# Patient Record
Sex: Female | Born: 1985 | Race: Black or African American | Hispanic: No | Marital: Single | Smoking: Light tobacco smoker
Health system: Southern US, Community
[De-identification: ages and names within clinical notes are randomized; demographics above are authoritative.]

## PROBLEM LIST (undated history)

## (undated) DIAGNOSIS — G43909 Migraine, unspecified, not intractable, without status migrainosus: Secondary | ICD-10-CM

## (undated) DIAGNOSIS — F419 Anxiety disorder, unspecified: Secondary | ICD-10-CM

## (undated) DIAGNOSIS — E785 Hyperlipidemia, unspecified: Secondary | ICD-10-CM

## (undated) DIAGNOSIS — I10 Essential (primary) hypertension: Secondary | ICD-10-CM

## (undated) HISTORY — DX: Anxiety disorder, unspecified: F41.9

## (undated) HISTORY — DX: Migraine, unspecified, not intractable, without status migrainosus: G43.909

## (undated) HISTORY — DX: Essential (primary) hypertension: I10

## (undated) HISTORY — DX: Morbid (severe) obesity due to excess calories: E66.01

## (undated) HISTORY — DX: Hyperlipidemia, unspecified: E78.5

---

## 2011-05-23 ENCOUNTER — Encounter: Payer: Self-pay | Admitting: Internal Medicine

## 2011-05-23 DIAGNOSIS — Z0000 Encounter for general adult medical examination without abnormal findings: Secondary | ICD-10-CM | POA: Insufficient documentation

## 2011-05-23 DIAGNOSIS — Z0001 Encounter for general adult medical examination with abnormal findings: Secondary | ICD-10-CM | POA: Insufficient documentation

## 2011-05-27 ENCOUNTER — Encounter: Payer: Self-pay | Admitting: Internal Medicine

## 2011-05-27 ENCOUNTER — Ambulatory Visit (INDEPENDENT_AMBULATORY_CARE_PROVIDER_SITE_OTHER): Payer: BC Managed Care – PPO | Admitting: Internal Medicine

## 2011-05-27 VITALS — BP 142/90 | HR 85 | Temp 97.9°F | Ht 68.0 in | Wt 256.0 lb

## 2011-05-27 DIAGNOSIS — Z0000 Encounter for general adult medical examination without abnormal findings: Secondary | ICD-10-CM

## 2011-05-27 DIAGNOSIS — E785 Hyperlipidemia, unspecified: Secondary | ICD-10-CM | POA: Insufficient documentation

## 2011-05-27 DIAGNOSIS — I10 Essential (primary) hypertension: Secondary | ICD-10-CM | POA: Insufficient documentation

## 2011-05-27 DIAGNOSIS — O119 Pre-existing hypertension with pre-eclampsia, unspecified trimester: Secondary | ICD-10-CM | POA: Insufficient documentation

## 2011-05-27 HISTORY — DX: Hyperlipidemia, unspecified: E78.5

## 2011-05-27 MED ORDER — HYDROCHLOROTHIAZIDE 25 MG PO TABS: 25.0000 mg | ORAL_TABLET | Freq: Every day | ORAL | Status: DC

## 2011-05-27 MED ORDER — AMLODIPINE BESYLATE 5 MG PO TABS: 5.0000 mg | ORAL_TABLET | Freq: Every day | ORAL | Status: DC

## 2011-05-27 NOTE — Patient Instructions (Addendum)
Take all new medications as prescribed Continue all other medications as before You are otherwise up to date with prevention Please follow lower cholesterol, low salt diet Please focus on increased exercise, weight loss Please check your Blood Pressure on a regular basis, your goal is to be less than 140/90 Please return in 6 months, or sooner if needed

## 2011-05-27 NOTE — Progress Notes (Signed)
Subjective:    Patient ID: Jennifer Allison, female    DOB: 04/23/1985, 26 y.o.   MRN: [id number]  HPI  Here for wellness and to establish as new pt;  Is local banker,  Overall doing ok;  Pt denies CP, worsening SOB, DOE, wheezing, orthopnea, PND, worsening LE edema, palpitations, dizziness or syncope.  Pt denies neurological change such as new Headache, facial or extremity weakness.  Pt denies polydipsia, polyuria, or low sugar symptoms. Pt states overall good compliance with treatment and medications, good tolerability, and trying to follow lower cholesterol diet.  Pt denies worsening depressive symptoms, suicidal ideation or panic. No fever, wt loss, night sweats, loss of appetite, or other constitutional symptoms.  Pt states good ability with ADL's, low fall risk, home safety reviewed and adequate, no significant changes in hearing or vision, and occasionally active with exercise.  Had recent labs per UC essentially normal.  BP has been documented severe elev prior to recent start HCTZ, now still mild elev prior to this visit per pt Past Medical History  Diagnosis Date  . Hypertension   . Migraines   . Hyperlipidemia 05/27/2011   History reviewed. No pertinent past surgical history.  reports that she has never smoked. She does not have any smokeless tobacco history on file. She reports that she drinks alcohol. She reports that she does not use illicit drugs. family history includes Cancer in her other; Diabetes in her others; Hyperlipidemia in her other; Hypertension in her other; Mental illness in her other; and Sudden death in her other. No Known Allergies No current outpatient prescriptions on file prior to visit.   Review of Systems Review of Systems  Constitutional: Negative for diaphoresis, activity change, appetite change and unexpected weight change.  HENT: Negative for hearing loss, ear pain, facial swelling, mouth sores and neck stiffness.   Eyes: Negative for pain, redness and  visual disturbance.  Respiratory: Negative for shortness of breath and wheezing.   Cardiovascular: Negative for chest pain and palpitations.  Gastrointestinal: Negative for diarrhea, blood in stool, abdominal distention and rectal pain.  Genitourinary: Negative for hematuria, flank pain and decreased urine volume.  Musculoskeletal: Negative for myalgias and joint swelling.  Skin: Negative for color change and wound.  Neurological: Negative for syncope and numbness.  Hematological: Negative for adenopathy.  Psychiatric/Behavioral: Negative for hallucinations, self-injury, decreased concentration and agitation.      Objective:   Physical Exam BP 142/90  Pulse 85  Temp(Src) 97.9 F (36.6 C) (Oral)  Ht 5\' 8"  (1.727 m)  Wt 256 lb (116.121 kg)  BMI 38.92 kg/m2  SpO2 98%  LMP 05/20/2011 Physical Exam  VS noted Constitutional: Pt is oriented to person, place, and time. Appears well-developed and well-nourished./severe to morbid obese  HENT:  Head: Normocephalic and atraumatic.  Right Ear: External ear normal.  Left Ear: External ear normal.  Nose: Nose normal.  Mouth/Throat: Oropharynx is clear and moist.  Eyes: Conjunctivae and EOM are normal. Pupils are equal, round, and reactive to light.  Neck: Normal range of motion. Neck supple. No JVD present. No tracheal deviation present.  Cardiovascular: Normal rate, regular rhythm, normal heart sounds and intact distal pulses.   Pulmonary/Chest: Effort normal and breath sounds normal.  Abdominal: Soft. Bowel sounds are normal. There is no tenderness.  Musculoskeletal: Normal range of motion. Exhibits no edema.  Lymphadenopathy:  Has no cervical adenopathy.  Neurological: Pt is alert and oriented to person, place, and time. Pt has normal reflexes. No cranial nerve deficit.  Skin: Skin is warm and dry. No rash noted.  Psychiatric:  Has  normal mood and affect. Behavior is normal.     Assessment & Plan:

## 2011-05-27 NOTE — Assessment & Plan Note (Signed)
Uncontrolled, to add amlodpine 5 mg daily, f/u BP at home and next visit

## 2011-05-27 NOTE — Assessment & Plan Note (Signed)

## 2011-08-26 ENCOUNTER — Telehealth: Payer: Self-pay

## 2011-08-26 MED ORDER — DILTIAZEM HCL ER COATED BEADS 180 MG PO CP24: 180.0000 mg | ORAL_CAPSULE | Freq: Every day | ORAL | Status: DC

## 2011-08-26 NOTE — Telephone Encounter (Signed)
Pt called requesting an alternative BP medication. Pt says Norvasc causes HA, grogginess and fatigue. She has tried taking medication at bedtime to help with SE but it did not improve. Pt is currently taking medication every other day only to help with effects (BP stable), please advise.

## 2011-08-26 NOTE — Telephone Encounter (Signed)
It is very unlikely the medication itself is causing these problems  But ok to change diltiazem 180 qd

## 2011-08-26 NOTE — Telephone Encounter (Signed)
Patient informed. 

## 2011-11-12 ENCOUNTER — Ambulatory Visit: Payer: BC Managed Care – PPO | Admitting: Internal Medicine

## 2011-11-16 ENCOUNTER — Ambulatory Visit: Payer: BC Managed Care – PPO | Admitting: Internal Medicine

## 2011-11-18 ENCOUNTER — Other Ambulatory Visit (INDEPENDENT_AMBULATORY_CARE_PROVIDER_SITE_OTHER): Payer: BC Managed Care – PPO

## 2011-11-18 ENCOUNTER — Other Ambulatory Visit: Payer: Self-pay | Admitting: Cardiology

## 2011-11-18 ENCOUNTER — Ambulatory Visit (INDEPENDENT_AMBULATORY_CARE_PROVIDER_SITE_OTHER): Payer: BC Managed Care – PPO | Admitting: Internal Medicine

## 2011-11-18 ENCOUNTER — Encounter: Payer: Self-pay | Admitting: Internal Medicine

## 2011-11-18 VITALS — BP 120/82 | HR 86 | Temp 97.5°F | Ht 68.0 in | Wt 266.0 lb

## 2011-11-18 DIAGNOSIS — R0600 Dyspnea, unspecified: Secondary | ICD-10-CM

## 2011-11-18 DIAGNOSIS — R0609 Other forms of dyspnea: Secondary | ICD-10-CM

## 2011-11-18 DIAGNOSIS — R002 Palpitations: Secondary | ICD-10-CM

## 2011-11-18 DIAGNOSIS — R52 Pain, unspecified: Secondary | ICD-10-CM

## 2011-11-18 DIAGNOSIS — R609 Edema, unspecified: Secondary | ICD-10-CM

## 2011-11-18 DIAGNOSIS — M79604 Pain in right leg: Secondary | ICD-10-CM | POA: Insufficient documentation

## 2011-11-18 DIAGNOSIS — M79609 Pain in unspecified limb: Secondary | ICD-10-CM

## 2011-11-18 DIAGNOSIS — I10 Essential (primary) hypertension: Secondary | ICD-10-CM

## 2011-11-18 DIAGNOSIS — R0989 Other specified symptoms and signs involving the circulatory and respiratory systems: Secondary | ICD-10-CM

## 2011-11-18 LAB — CBC WITH DIFFERENTIAL/PLATELET
Basophils Absolute: 0 10*3/uL (ref 0.0–0.1)
Eosinophils Relative: 1.6 % (ref 0.0–5.0)
HCT: 47.5 % — ABNORMAL HIGH (ref 36.0–46.0)
Hemoglobin: 15.4 g/dL — ABNORMAL HIGH (ref 12.0–15.0)
Lymphocytes Relative: 29.2 % (ref 12.0–46.0)
Lymphs Abs: 3.2 10*3/uL (ref 0.7–4.0)
Monocytes Relative: 7 % (ref 3.0–12.0)
Platelets: 271 10*3/uL (ref 150.0–400.0)
RDW: 14.8 % — ABNORMAL HIGH (ref 11.5–14.6)
WBC: 10.9 10*3/uL — ABNORMAL HIGH (ref 4.5–10.5)

## 2011-11-18 LAB — HEPATIC FUNCTION PANEL
AST: 38 U/L — ABNORMAL HIGH (ref 0–37)
Albumin: 4.4 g/dL (ref 3.5–5.2)
Total Protein: 8.3 g/dL (ref 6.0–8.3)

## 2011-11-18 LAB — BASIC METABOLIC PANEL
BUN: 9 mg/dL (ref 6–23)
CO2: 29 mEq/L (ref 19–32)
Calcium: 9.5 mg/dL (ref 8.4–10.5)
GFR: 101.96 mL/min (ref >60.00)
Glucose, Bld: 93 mg/dL (ref 70–99)

## 2011-11-18 NOTE — Assessment & Plan Note (Addendum)
ECG reviewed as per emr - sinus without acute, and by itself also includes SVT in differential - will need DVT/PE rule out - but if neg should see card - r/o SVT

## 2011-11-18 NOTE — Progress Notes (Signed)
  Subjective:    Patient ID: Jennifer Allison, female    DOB: [birth year], 26 y.o.   MRN: [id number]  HPI  Here with 1 mo hx approx twice per wk palpitations assoc with sob, once when sleeping - woke her up, last about 1 min and resolved, also with dizziness; Pt denies chest wheezing, orthopnea, PND,  or syncope.  Also with unusual RLE pain below the knee with swelling and tightness (none on the left), pain worst at the beginning, minor pain now, had some ? Redness of the leg involved, but no knee pain except possible lower anterolateral; pain worse to stand and walk, better now, cant remember twisting the knee, hasnt been to the gym in over a month;  No fever, trauma, falls, hx of joint issues or gout.  Not on BCP's. No hx of DVT, no FH blood clots.  Missed mense in July, saw GYN.  Not pregnant, on menses now. Pt denies new neurological symptoms such as new headache, or facial or extremity weakness or numbness   Pt denies polydipsia, polyuria.  Has not been taking her diltiazem recently due to dizziness and HA Past Medical History  Diagnosis Date  . Hypertension   . Migraines   . Hyperlipidemia 05/27/2011   No past surgical history on file.  reports that she has never smoked. She does not have any smokeless tobacco history on file. She reports that she drinks alcohol. She reports that she does not use illicit drugs. family history includes Cancer in her other; Diabetes in her others; Hyperlipidemia in her other; Hypertension in her other; Mental illness in her other; and Sudden death in her other. No Known Allergies Current Outpatient Prescriptions on File Prior to Visit  Medication Sig Dispense Refill  . hydrochlorothiazide (HYDRODIURIL) 25 MG tablet Take 1 tablet (25 mg total) by mouth daily.  90 tablet  3  . diltiazem (DILT-CD) 180 MG 24 hr capsule Take 1 capsule (180 mg total) by mouth daily.  90 capsule  3   Review of Systems  Constitutional: Negative for diaphoresis and unexpected weight  change.  HENT: Negative for tinnitus.   Eyes: Negative for photophobia and visual disturbance.  Respiratory: Negative for choking and stridor.   Gastrointestinal: Negative for vomiting and blood in stool.  Genitourinary: Negative for hematuria and decreased urine volume.  Skin: Negative for color change and wound.  Neurological: Negative for tremors and numbness.  Psychiatric/Behavioral: Negative for decreased concentration. The patient is not hyperactive.      Objective:   Physical Exam BP 120/82  Pulse 86  Temp 97.5 F (36.4 C) (Oral)  Ht 5\' 8"  (1.727 m)  Wt 266 lb (120.657 kg)  BMI 40.45 kg/m2  SpO2 97% Physical Exam  VS noted, not ill appearing Constitutional: Pt appears well-developed and well-nourished.  HENT: Head: Normocephalic.  Right Ear: External ear normal.  Left Ear: External ear normal.  Eyes: Conjunctivae and EOM are normal. Pupils are equal, round, and reactive to light.  Neck: Normal range of motion. Neck supple.  Cardiovascular: Normal rate and regular rhythm.   Pulmonary/Chest: Effort normal and breath sounds normal.  Abd:  Soft, NT, non-distended, + BS Neurological: Pt is alert. Not confused motor/gait intact Skin: no erythema, ulcer Right knee with ? Mild effusion; RLEwith 2+ edema/mild tender below knee Psychiatric: Pt behavior is normal. Thought content normal.     Assessment & Plan:

## 2011-11-18 NOTE — Patient Instructions (Addendum)
Please see the [facility] before leaving to have the CT scan scheduled asap, as well as the Right leg test to make sure there is no blood clot If the CT and the right leg doppler tests are negative, you should see Cardiology for the palpitations, and orthopedic for the right knee and leg Please go to LAB in the Basement for the blood and/or urine tests to be done today You will be contacted by phone if any changes need to be made immediately.  Otherwise, you will receive a letter about your results with an explanation. Please remember to sign up for My Chart at your earliest convenience, as this will be important to you in the future with finding out test results. OK to stop the diltiazem, Continue all other medications as before

## 2011-11-18 NOTE — Assessment & Plan Note (Addendum)
1 mo recurrent episodes nonexertional dyspnea with palpitations, sob, also with RLE swelling suspecious for DVT at same time - will need to r/o PE - for CT angio  Note:  Total time for pt hx, exam, review of record with pt in the room, determination of diagnoses and plan for further eval and tx is > 40 min, with over 50% spent in coordination and counseling of patient

## 2011-11-18 NOTE — Assessment & Plan Note (Signed)
With unilateral swelling x 1 mo with pain - knee effusion is in differential, but need r/o DVT - for LE venous dopplers, d-dimer; if neg should see ortho for right knee pain

## 2011-11-18 NOTE — Assessment & Plan Note (Addendum)
Ok for d/c diltiazem, to hold further med at this time

## 2011-11-19 ENCOUNTER — Ambulatory Visit (INDEPENDENT_AMBULATORY_CARE_PROVIDER_SITE_OTHER)
Admission: RE | Admit: 2011-11-19 | Discharge: 2011-11-19 | Disposition: A | Discharge status: Home / Self Care | Payer: BC Managed Care – PPO | Source: Ambulatory Visit | Attending: Internal Medicine | Admitting: Internal Medicine

## 2011-11-19 ENCOUNTER — Encounter: Payer: Self-pay | Admitting: Internal Medicine

## 2011-11-19 ENCOUNTER — Telehealth: Payer: Self-pay | Admitting: Internal Medicine

## 2011-11-19 ENCOUNTER — Encounter (INDEPENDENT_AMBULATORY_CARE_PROVIDER_SITE_OTHER): Payer: BC Managed Care – PPO

## 2011-11-19 DIAGNOSIS — M7989 Other specified soft tissue disorders: Secondary | ICD-10-CM

## 2011-11-19 DIAGNOSIS — R002 Palpitations: Secondary | ICD-10-CM

## 2011-11-19 DIAGNOSIS — M79604 Pain in right leg: Secondary | ICD-10-CM

## 2011-11-19 DIAGNOSIS — R52 Pain, unspecified: Secondary | ICD-10-CM

## 2011-11-19 DIAGNOSIS — R0609 Other forms of dyspnea: Secondary | ICD-10-CM

## 2011-11-19 DIAGNOSIS — R609 Edema, unspecified: Secondary | ICD-10-CM

## 2011-11-19 DIAGNOSIS — R0600 Dyspnea, unspecified: Secondary | ICD-10-CM

## 2011-11-19 DIAGNOSIS — M79609 Pain in unspecified limb: Secondary | ICD-10-CM

## 2011-11-19 DIAGNOSIS — M25569 Pain in unspecified knee: Secondary | ICD-10-CM

## 2011-11-19 LAB — TSH: TSH: 1.23 u[IU]/mL (ref 0.35–5.50)

## 2011-11-19 LAB — D-DIMER, QUANTITATIVE: D-Dimer, Quant: 0.27 ug/mL-FEU (ref 0.00–0.48)

## 2011-11-19 MED ORDER — IOHEXOL 350 MG/ML SOLN
80.0000 mL | Freq: Once | INTRAVENOUS | Status: AC | PRN
  Administered 2011-11-19: 80 mL via INTRAVENOUS

## 2011-11-19 NOTE — Telephone Encounter (Signed)
Called the patient left detailed message of results. 

## 2011-11-19 NOTE — Telephone Encounter (Signed)
i received notice pt is neg for acute DVT RLE  OK for card/ortho eval as d/w pt at her visit

## 2011-11-19 NOTE — Telephone Encounter (Signed)
Message copied by Corwin Levins on Thu Nov 19, 2011  2:24 PM ------      Message from: Richardean Canal      Created: Thu Nov 19, 2011  1:29 PM      Regarding: prelim       Patient appears negative for acute DVT in the right leg.                  Thank you

## 2011-11-25 ENCOUNTER — Ambulatory Visit: Payer: BC Managed Care – PPO | Admitting: Cardiovascular Disease

## 2011-11-25 ENCOUNTER — Ambulatory Visit: Payer: BC Managed Care – PPO | Admitting: Internal Medicine

## 2012-01-11 ENCOUNTER — Ambulatory Visit: Payer: BC Managed Care – PPO | Admitting: Cardiovascular Disease

## 2012-04-21 ENCOUNTER — Telehealth: Payer: Self-pay | Admitting: Internal Medicine

## 2012-04-21 MED ORDER — AMLODIPINE BESYLATE 5 MG PO TABS: 5.0000 mg | ORAL_TABLET | Freq: Every day | ORAL | Status: DC

## 2012-04-21 NOTE — Telephone Encounter (Signed)
Ok to change the hct to amlodipine 5 mg, which normally has no significant side effects

## 2012-04-21 NOTE — Telephone Encounter (Signed)
Patient says that her blood pressure medications is still making her sick and she would like to discuss other options

## 2012-04-22 NOTE — Telephone Encounter (Signed)
I am very confused about her med regimen as the taztia is not on her list  I would take the amlodipine, and check her BP at home or drug store, and ROV in 4wks

## 2012-04-22 NOTE — Telephone Encounter (Signed)
Called the patient and she stated the HCT was not making her sick, but was [place] that made her sick.  Is it ok to discontinue the [place] and replace with Amlodipine, also to continue on HCT?

## 2012-04-22 NOTE — Telephone Encounter (Signed)
Called left message to call back 

## 2012-04-22 NOTE — Telephone Encounter (Signed)
Called the patient informed of MD instructions. 

## 2012-11-29 ENCOUNTER — Other Ambulatory Visit: Payer: Self-pay | Admitting: Internal Medicine

## 2013-09-07 IMAGING — CT CT ANGIO CHEST
2 of 6 series · 19 of 36 positions shown · IV contrast (Omnipaque 300)
Comparison: None.

CLINICAL DATA: Shortness of breath, right leg swelling.

CT ANGIOGRAPHY CHEST
TECHNIQUE: Multidetector CT imaging of the chest using the
standard protocol during bolus administration of intravenous
contrast. Multiplanar reconstructed images including MIPs were
obtained and reviewed to evaluate the vascular anatomy.
Contrast: 80mL OMNIPAQUE IOHEXOL 350 MG/ML SOLN

[Series 6: thins (id) / (id) · axial · 0.75mm/px · z∈[-230,-19]mm · 18 of 235 slices shown]
[im 12/235  lung]
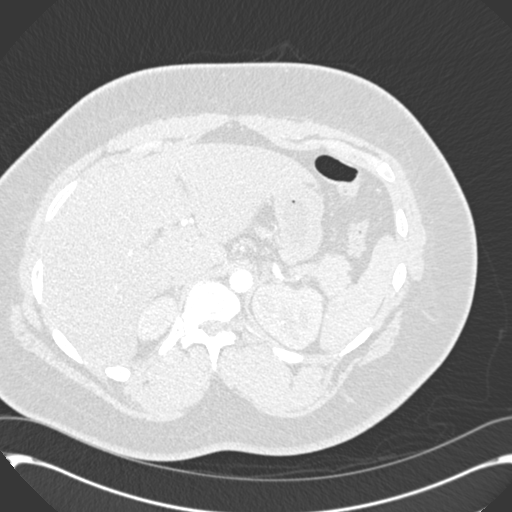
[im 24/235  mediastinal]
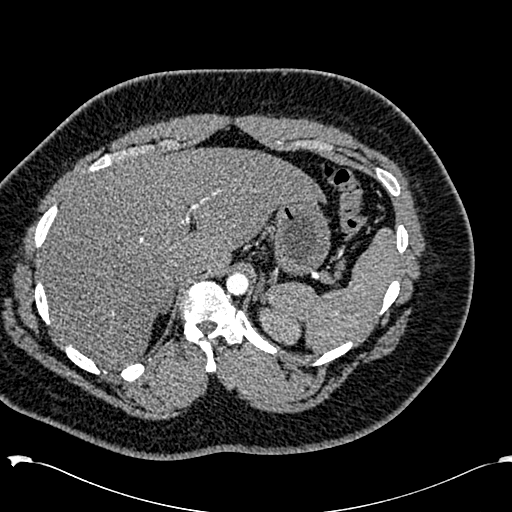
[im 36/235  lung]
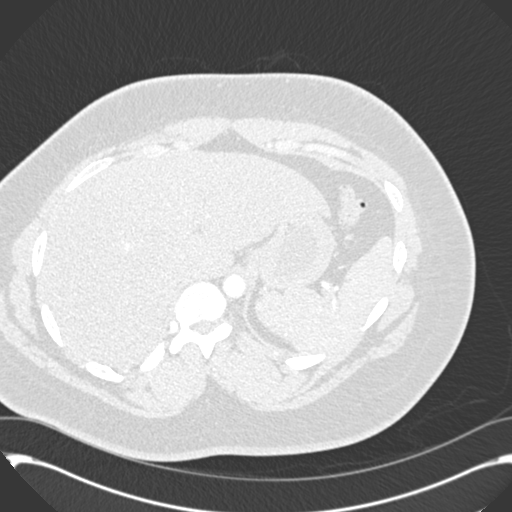
[im 47/235  mediastinal]
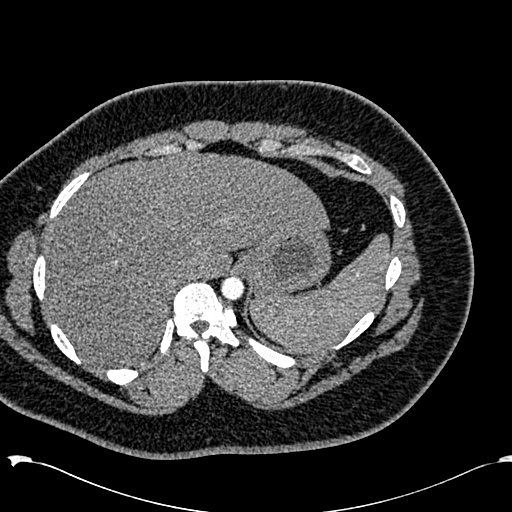
[im 59/235  lung]
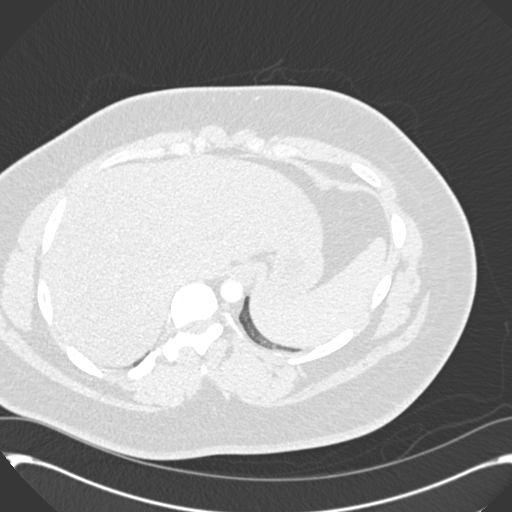
[im 71/235  mediastinal]
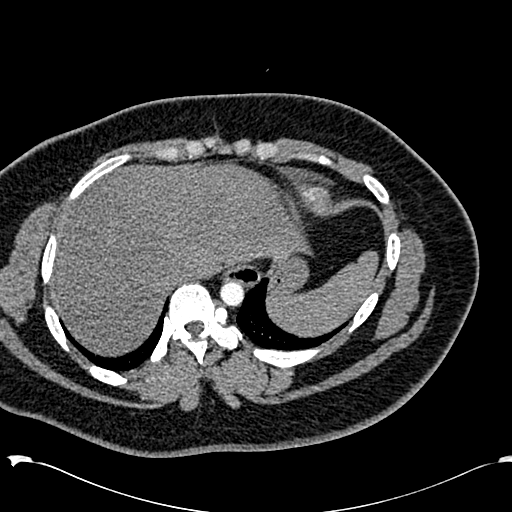
[im 82/235  lung]
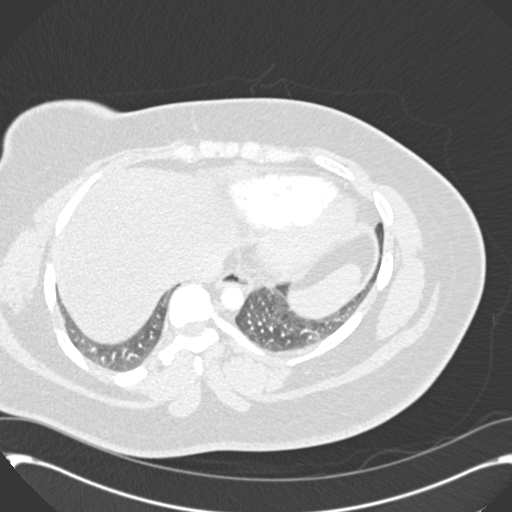
[im 94/235  mediastinal]
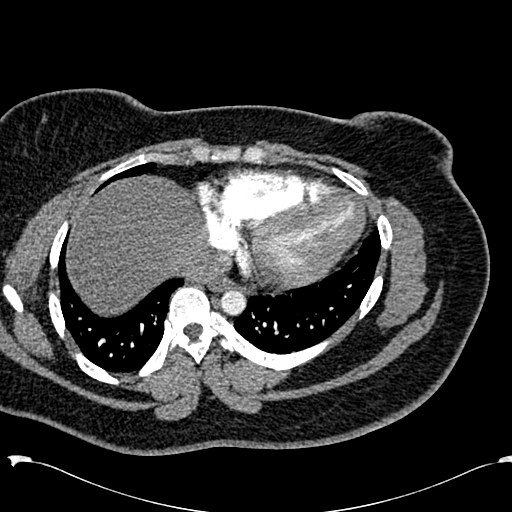
[im 106/235  lung]
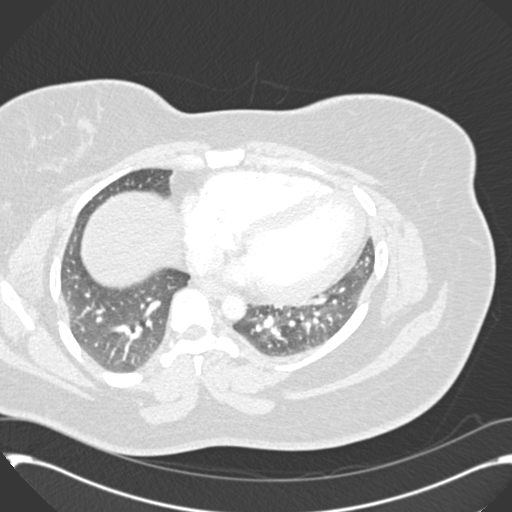
[im 129/235  mediastinal]
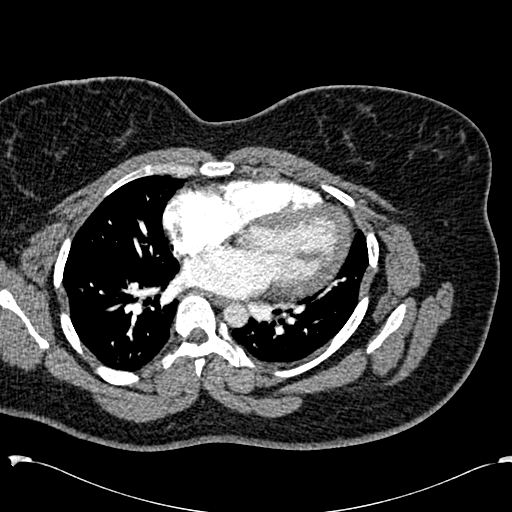
[im 141/235  lung]
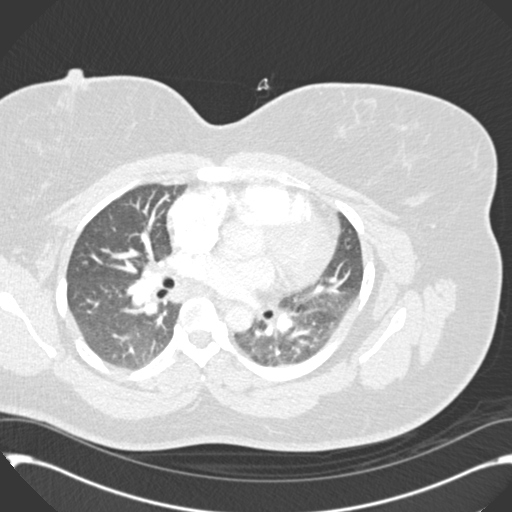
[im 153/235  mediastinal]
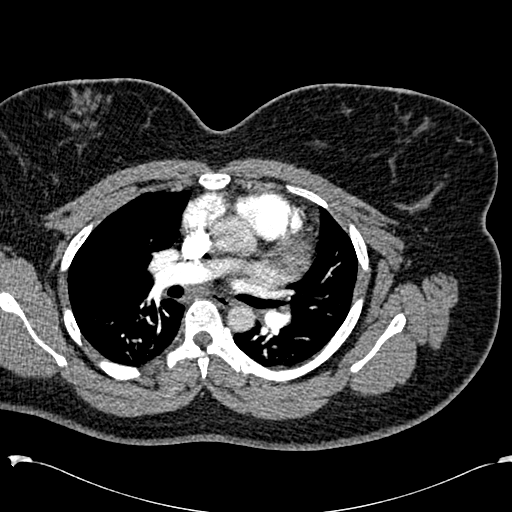
[im 164/235  lung]
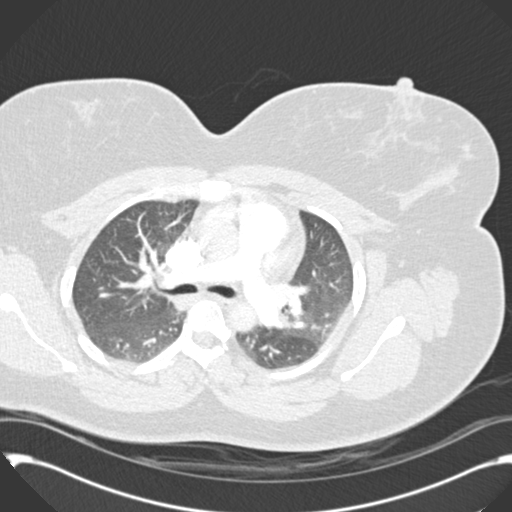
[im 176/235  mediastinal]
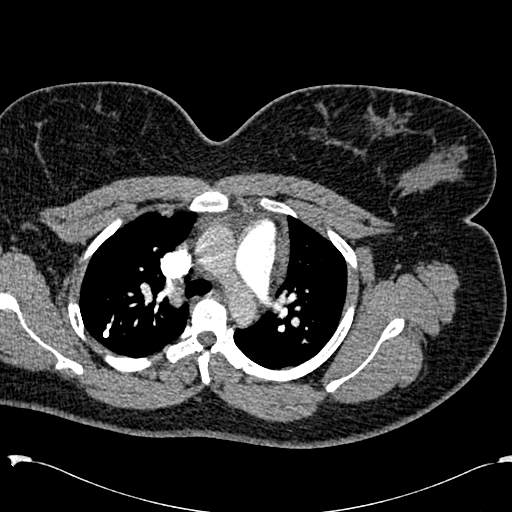
[im 188/235  lung]
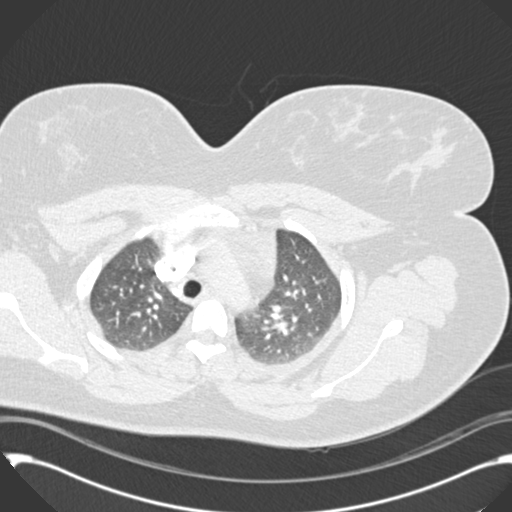
[im 199/235  mediastinal]
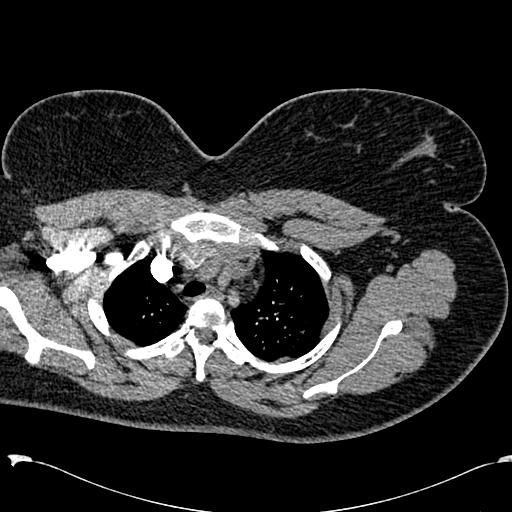
[im 211/235  lung]
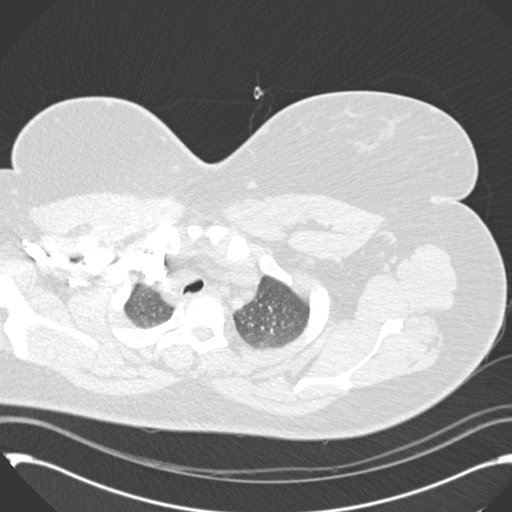
[im 223/235  mediastinal]
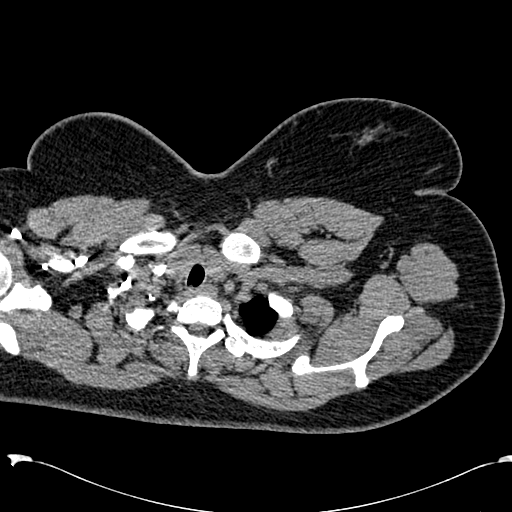

[Series 602: cor mpr · coronal · 0.75mm/px · 1 of 79 slices shown]
[im 40/79  mediastinal]
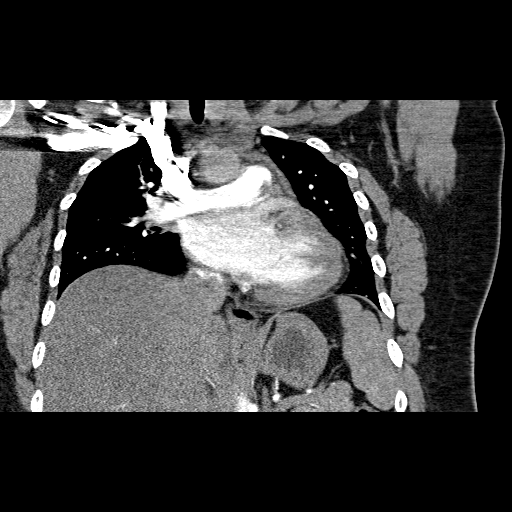

[19 of 36 positions shown; findings below may reference images not displayed]

FINDINGS: Nonspecific anterior mediastinal soft tissue may reflect
residual thymus. Asymmetric soft tissue density within the left
breast on image 60 series 6.  No pulmonary arterial branch filling
defect.  Mild cardiomegaly.  No pleural or pericardial effusion.
Normal caliber aorta.

Low attenuation of the liver may reflect fatty infiltration.
Punctate splenic calcifications are in keeping with sequelae of
prior granulomas infection.

No intrathoracic lymphadenopathy. Calcified mediastinal and right
hilar lymph nodes.

Central airways are patent.

Mosaic attenuation of the left greater than right lung base may
reflect areas of air trapping and atelectasis.  No confluent
airspace opacity.  Calcified nodule right upper lobe suggest
sequelae of granulomas infection.  No acute osseous finding.
IMPRESSION: No pulmonary arterial filling defect identified.

Cardiomegaly.

Mosaic attenuation within the lung bases may reflect air trapping
and/or atelectasis.

Hepatic steatosis suggested.

Soft tissue density within the left breast is favored to reflect
asymmetric breast tissue.  Consider mammographic correlation.

## 2013-10-02 ENCOUNTER — Other Ambulatory Visit: Payer: Self-pay | Admitting: Internal Medicine

## 2013-10-04 ENCOUNTER — Other Ambulatory Visit: Payer: Self-pay | Admitting: Internal Medicine

## 2013-10-18 ENCOUNTER — Other Ambulatory Visit (INDEPENDENT_AMBULATORY_CARE_PROVIDER_SITE_OTHER): Payer: BC Managed Care – PPO

## 2013-10-18 ENCOUNTER — Ambulatory Visit (INDEPENDENT_AMBULATORY_CARE_PROVIDER_SITE_OTHER): Payer: BC Managed Care – PPO | Admitting: Internal Medicine

## 2013-10-18 ENCOUNTER — Encounter: Payer: Self-pay | Admitting: Internal Medicine

## 2013-10-18 VITALS — BP 114/88 | HR 93 | Temp 98.5°F | Wt 285.2 lb

## 2013-10-18 DIAGNOSIS — I10 Essential (primary) hypertension: Secondary | ICD-10-CM

## 2013-10-18 DIAGNOSIS — Z0000 Encounter for general adult medical examination without abnormal findings: Secondary | ICD-10-CM

## 2013-10-18 LAB — HEPATIC FUNCTION PANEL
ALBUMIN: 4.1 g/dL (ref 3.5–5.2)
ALT: 32 U/L (ref 0–35)
AST: 27 U/L (ref 0–37)
Alkaline Phosphatase: 92 U/L (ref 39–117)
Bilirubin, Direct: 0.1 mg/dL (ref 0.0–0.3)
Total Bilirubin: 0.7 mg/dL (ref 0.2–1.2)
Total Protein: 7.9 g/dL (ref 6.0–8.3)

## 2013-10-18 LAB — LIPID PANEL
Cholesterol: 185 mg/dL (ref 0–200)
HDL: 42.4 mg/dL (ref >39.00)
LDL Cholesterol: 124 mg/dL — ABNORMAL HIGH (ref 0–99)
NONHDL: 142.6
Total CHOL/HDL Ratio: 4
Triglycerides: 92 mg/dL (ref 0.0–149.0)
VLDL: 18.4 mg/dL (ref 0.0–40.0)

## 2013-10-18 LAB — BASIC METABOLIC PANEL
BUN: 9 mg/dL (ref 6–23)
CHLORIDE: 106 meq/L (ref 96–112)
CO2: 27 mEq/L (ref 19–32)
CREATININE: 0.9 mg/dL (ref 0.4–1.2)
Calcium: 9.5 mg/dL (ref 8.4–10.5)
GFR: 93.01 mL/min (ref >60.00)
Glucose, Bld: 84 mg/dL (ref 70–99)
Potassium: 4.1 mEq/L (ref 3.5–5.1)
Sodium: 140 mEq/L (ref 135–145)

## 2013-10-18 LAB — CBC WITH DIFFERENTIAL/PLATELET
BASOS PCT: 0.4 % (ref 0.0–3.0)
Basophils Absolute: 0 10*3/uL (ref 0.0–0.1)
Eosinophils Absolute: 0.2 10*3/uL (ref 0.0–0.7)
Eosinophils Relative: 1.4 % (ref 0.0–5.0)
HCT: 49.1 % — ABNORMAL HIGH (ref 36.0–46.0)
Hemoglobin: 16.3 g/dL — ABNORMAL HIGH (ref 12.0–15.0)
Lymphocytes Relative: 30.5 % (ref 12.0–46.0)
Lymphs Abs: 3.7 10*3/uL (ref 0.7–4.0)
MCHC: 33.2 g/dL (ref 30.0–36.0)
MCV: 88.4 fl (ref 78.0–100.0)
MONOS PCT: 7.3 % (ref 3.0–12.0)
Monocytes Absolute: 0.9 10*3/uL (ref 0.1–1.0)
Neutro Abs: 7.4 10*3/uL (ref 1.4–7.7)
Neutrophils Relative %: 60.4 % (ref 43.0–77.0)
Platelets: 278 10*3/uL (ref 150.0–400.0)
RBC: 5.56 Mil/uL — AB (ref 3.87–5.11)
RDW: 13.4 % (ref 11.5–15.5)
WBC: 12.3 10*3/uL — AB (ref 4.0–10.5)

## 2013-10-18 LAB — TSH: TSH: 0.73 u[IU]/mL (ref 0.35–4.50)

## 2013-10-18 MED ORDER — AMLODIPINE BESYLATE 5 MG PO TABS: 5.0000 mg | ORAL_TABLET | Freq: Every day | ORAL | Status: DC

## 2013-10-18 MED ORDER — HYDROCHLOROTHIAZIDE 12.5 MG PO TABS: 12.5000 mg | ORAL_TABLET | Freq: Every day | ORAL | Status: DC

## 2013-10-18 NOTE — Progress Notes (Signed)
Pre visit review using our clinic review tool, if applicable. No additional management support is needed unless otherwise documented below in the visit note. 

## 2013-10-18 NOTE — Progress Notes (Signed)
Subjective:    Patient ID: Jennifer Allison, female    DOB: 05-Sep-1985, 28 y.o.   MRN: [id number]  HPI  Here for wellness and f/u;  Overall doing ok;  Pt denies CP, worsening SOB, DOE, wheezing, orthopnea, PND, worsening LE edema, palpitations, dizziness or syncope.  Pt denies neurological change such as new headache, facial or extremity weakness.  Pt denies polydipsia, polyuria, or low sugar symptoms. Pt states overall good compliance with treatment and medications, good tolerability, and has been trying to follow lower cholesterol diet.  Pt denies worsening depressive symptoms, suicidal ideation or panic. No fever, night sweats, wt loss, loss of appetite, or other constitutional symptoms.  Pt states good ability with ADL's, has low fall risk, home safety reviewed and adequate, no other significant changes in hearing or vision, and only occasionally active with exercise.  Only taking the hct prn for swelling mostly. No acute complaints Past Medical History  Diagnosis Date  . Hypertension   . Migraines   . Hyperlipidemia 05/27/2011   No past surgical history on file.  reports that she has never smoked. She does not have any smokeless tobacco history on file. She reports that she drinks alcohol. She reports that she does not use illicit drugs. family history includes Cancer in her other; Diabetes in her other and other; Hyperlipidemia in her other; Hypertension in her other; Mental illness in her other; Sudden death in her other. No Known Allergies No current outpatient prescriptions on file prior to visit.   No current facility-administered medications on file prior to visit.    Review of Systems Constitutional: Negative for increased diaphoresis, other activity, appetite or other siginficant weight change  HENT: Negative for worsening hearing loss, ear pain, facial swelling, mouth sores and neck stiffness.   Eyes: Negative for other worsening pain, redness or visual disturbance.    Respiratory: Negative for shortness of breath and wheezing.   Cardiovascular: Negative for chest pain and palpitations.  Gastrointestinal: Negative for diarrhea, blood in stool, abdominal distention or other pain Genitourinary: Negative for hematuria, flank pain or change in urine volume.  Musculoskeletal: Negative for myalgias or other joint complaints.  Skin: Negative for color change and wound.  Neurological: Negative for syncope and numbness. other than noted Hematological: Negative for adenopathy. or other swelling Psychiatric/Behavioral: Negative for hallucinations, self-injury, decreased concentration or other worsening agitation.      Objective:   Physical Exam BP 114/88  Pulse 93  Temp(Src) 98.5 F (36.9 C) (Oral)  Wt 285 lb 4 oz (129.389 kg)  SpO2 97% VS noted,  Constitutional: Pt is oriented to person, place, and time. Appears well-developed and well-nourished.  Head: Normocephalic and atraumatic.  Right Ear: External ear normal.  Left Ear: External ear normal.  Nose: Nose normal.  Mouth/Throat: Oropharynx is clear and moist.  Eyes: Conjunctivae and EOM are normal. Pupils are equal, round, and reactive to light.  Neck: Normal range of motion. Neck supple. No JVD present. No tracheal deviation present.  Cardiovascular: Normal rate, regular rhythm, normal heart sounds and intact distal pulses.   Pulmonary/Chest: Effort normal and breath sounds without rales or wheezing  Abdominal: Soft. Bowel sounds are normal. NT. No HSM  Musculoskeletal: Normal range of motion. Exhibits slight trace pedal edema.  Lymphadenopathy:  Has no cervical adenopathy.  Neurological: Pt is alert and oriented to person, place, and time. Pt has normal reflexes. No cranial nerve deficit. Motor grossly intact Skin: Skin is warm and dry. No rash noted.  Psychiatric:  Has normal mood and affect. Behavior is normal.      Assessment & Plan:   BP Readings from Last 3 Encounters:  10/18/13 114/88   11/18/11 120/82  05/27/11 142/90

## 2013-10-18 NOTE — Assessment & Plan Note (Signed)
[facility] for refill meds, stable overall by history and exam, recent data reviewed with pt, and pt to continue medical treatment as before,  to f/u any worsening symptoms or concerns BP Readings from Last 3 Encounters:  10/18/13 114/88  11/18/11 120/82  05/27/11 142/90

## 2013-10-18 NOTE — Patient Instructions (Signed)

## 2013-10-18 NOTE — Assessment & Plan Note (Signed)

## 2013-10-19 ENCOUNTER — Encounter: Payer: Self-pay | Admitting: Internal Medicine

## 2013-10-19 LAB — URINALYSIS, ROUTINE W REFLEX MICROSCOPIC
Bilirubin Urine: NEGATIVE
Ketones, ur: NEGATIVE
NITRITE: NEGATIVE
PH: 5 (ref 5.0–8.0)
SPECIFIC GRAVITY, URINE: 1.025 (ref 1.000–1.030)
Total Protein, Urine: NEGATIVE
URINE GLUCOSE: NEGATIVE
Urobilinogen, UA: 0.2 (ref 0.0–1.0)

## 2015-04-11 ENCOUNTER — Encounter (HOSPITAL_COMMUNITY): Payer: Self-pay | Admitting: Emergency Medicine

## 2015-04-11 ENCOUNTER — Other Ambulatory Visit: Payer: Self-pay

## 2015-04-11 ENCOUNTER — Emergency Department (INDEPENDENT_AMBULATORY_CARE_PROVIDER_SITE_OTHER)
Admission: EM | Admit: 2015-04-11 | Discharge: 2015-04-11 | Disposition: A | Discharge status: Nursing Home (Includes ICF) | Payer: 59 | Source: Home / Self Care | Attending: Family Medicine | Admitting: Family Medicine

## 2015-04-11 ENCOUNTER — Emergency Department (HOSPITAL_COMMUNITY): Payer: 59

## 2015-04-11 ENCOUNTER — Emergency Department (HOSPITAL_COMMUNITY)
Admission: EM | Admit: 2015-04-11 | Discharge: 2015-04-12 | Disposition: A | Discharge status: Home / Self Care | Payer: 59 | Attending: Emergency Medicine | Admitting: Emergency Medicine

## 2015-04-11 DIAGNOSIS — R079 Chest pain, unspecified: Secondary | ICD-10-CM

## 2015-04-11 DIAGNOSIS — Z8639 Personal history of other endocrine, nutritional and metabolic disease: Secondary | ICD-10-CM | POA: Diagnosis not present

## 2015-04-11 DIAGNOSIS — I10 Essential (primary) hypertension: Secondary | ICD-10-CM | POA: Diagnosis not present

## 2015-04-11 DIAGNOSIS — Z880 Allergy status to penicillin: Secondary | ICD-10-CM | POA: Insufficient documentation

## 2015-04-11 DIAGNOSIS — G43909 Migraine, unspecified, not intractable, without status migrainosus: Secondary | ICD-10-CM | POA: Insufficient documentation

## 2015-04-11 DIAGNOSIS — Z79899 Other long term (current) drug therapy: Secondary | ICD-10-CM | POA: Diagnosis not present

## 2015-04-11 DIAGNOSIS — R000 Tachycardia, unspecified: Secondary | ICD-10-CM | POA: Insufficient documentation

## 2015-04-11 LAB — BASIC METABOLIC PANEL
Anion gap: 9 (ref 5–15)
BUN: 10 mg/dL (ref 6–20)
CALCIUM: 9.3 mg/dL (ref 8.9–10.3)
CO2: 24 mmol/L (ref 22–32)
CREATININE: 0.9 mg/dL (ref 0.44–1.00)
Chloride: 106 mmol/L (ref 101–111)
GFR calc Af Amer: >60 mL/min (ref >60)
GLUCOSE: 92 mg/dL (ref 65–99)
Potassium: 3.9 mmol/L (ref 3.5–5.1)
Sodium: 139 mmol/L (ref 135–145)

## 2015-04-11 LAB — CBC
HCT: 51.3 % — ABNORMAL HIGH (ref 36.0–46.0)
HEMOGLOBIN: 17.6 g/dL — AB (ref 12.0–15.0)
MCH: 30.9 pg (ref 26.0–34.0)
MCHC: 34.3 g/dL (ref 30.0–36.0)
MCV: 90.2 fL (ref 78.0–100.0)
Platelets: 286 10*3/uL (ref 150–400)
RBC: 5.69 MIL/uL — ABNORMAL HIGH (ref 3.87–5.11)
RDW: 13.6 % (ref 11.5–15.5)
WBC: 13 10*3/uL — ABNORMAL HIGH (ref 4.0–10.5)

## 2015-04-11 LAB — I-STAT TROPONIN, ED
TROPONIN I, POC: 0 ng/mL (ref 0.00–0.08)
Troponin i, poc: 0 ng/mL (ref 0.00–0.08)

## 2015-04-11 LAB — D-DIMER, QUANTITATIVE: D-Dimer, Quant: <0.27 ug/mL-FEU (ref 0.00–0.50)

## 2015-04-11 MED ORDER — LISINOPRIL 5 MG PO TABS: 5.0000 mg | ORAL_TABLET | Freq: Every day | ORAL | Status: DC

## 2015-04-11 MED ORDER — LISINOPRIL 10 MG PO TABS
5.0000 mg | ORAL_TABLET | Freq: Once | ORAL | Status: AC
  Administered 2015-04-11: 5 mg via ORAL
  Filled 2015-04-11: qty 1

## 2015-04-11 MED ORDER — ASPIRIN 81 MG PO CHEW
324.0000 mg | CHEWABLE_TABLET | Freq: Once | ORAL | Status: AC
  Administered 2015-04-11: 324 mg via ORAL
  Filled 2015-04-11: qty 4

## 2015-04-11 NOTE — ED Provider Notes (Signed)
CSN: [id number]     Arrival date & time 04/11/15  1552 History   First MD Initiated Contact with Patient 04/11/15 1651     Chief Complaint  Patient presents with  . Chest Pain  . Arm Pain   (Consider location/radiation/quality/duration/timing/severity/associated sxs/prior Treatment) Patient is a 30 y.o. female presenting with chest pain and arm pain. The history is provided by the patient. No language interpreter was used.  Chest Pain Pain location:  L chest Pain quality: aching   Pain radiates to:  Does not radiate Pain radiates to the back: no   Pain severity:  No pain Onset quality:  Gradual Timing:  Constant Progression:  Worsening Chronicity:  New Context: no movement   Relieved by:  Nothing Worsened by:  Nothing tried Ineffective treatments:  None tried Risk factors: birth control, high cholesterol and hypertension   Risk factors: no prior DVT/PE   Arm Pain Associated symptoms include chest pain.  Pt had pain in her left leg yesterday.  Pt reports today she began having pain in her chest.  Pt noticed when she awoke this am.  Pt is not taking her blood pressure medications Pt reports tingling in her left arm.  Past Medical History  Diagnosis Date  . Hypertension   . Migraines   . Hyperlipidemia 05/27/2011   History reviewed. No pertinent past surgical history. Family History  Problem Relation Age of Onset  . Hypertension Other   . Mental illness Other   . Hyperlipidemia Other   . Diabetes Other   . Cancer Other     prostate  . Sudden death Other   . Diabetes Other    Social History  Substance Use Topics  . Smoking status: Never Smoker   . Smokeless tobacco: None  . Alcohol Use: Yes     Comment: social   OB History    No data available     Review of Systems  Cardiovascular: Positive for chest pain.  All other systems reviewed and are negative.   Allergies  Review of patient's allergies indicates no known allergies.  Home Medications   Prior to  Admission medications   Medication Sig Start Date End Date Taking? Authorizing Provider  amLODipine (NORVASC) 5 MG tablet Take 1 tablet (5 mg total) by mouth daily. 10/18/13 10/18/14  Biagio Borg, MD  hydrochlorothiazide (HYDRODIURIL) 12.5 MG tablet Take 1 tablet (12.5 mg total) by mouth daily. 10/18/13   Biagio Borg, MD   Meds Ordered and Administered this Visit  Medications - No data to display  BP 187/117 mmHg  Pulse 97  Temp(Src) 98.4 F (36.9 C) (Oral)  Resp 18  SpO2 98% No data found.   Physical Exam  Constitutional: She is oriented to person, place, and time. She appears well-developed and well-nourished.  HENT:  Head: Normocephalic and atraumatic.  Left Ear: External ear normal.  Eyes: Conjunctivae are normal. Pupils are equal, round, and reactive to light.  Neck: Normal range of motion. Neck supple.  Cardiovascular: Normal rate and regular rhythm.   Pulmonary/Chest: Effort normal.  Abdominal: Soft. There is no tenderness.  Musculoskeletal: Normal range of motion.  Neurological: She is oriented to person, place, and time.  Skin: Skin is warm.    ED Course  Procedures (including critical care time)  Labs Review Labs Reviewed - No data to display  Imaging Review No results found.   Visual Acuity Review  Right Eye Distance:   Left Eye Distance:   Bilateral Distance:  Right Eye Near:   Left Eye Near:    Bilateral Near:         MDM  Pt needs to go to ED for troponin and possible ddimer.     1. Chest pain, unspecified chest pain type   2. Essential hypertension       Fransico Meadow, PA-C 04/11/15 1728

## 2015-04-11 NOTE — ED Notes (Signed)
Patient reports yesterday that left leg was throbbing, no known injury.  Today patient has left chest pain and left arm pain.  Reports left palm is numb.  Chest is described as tight.  Patient stopped blood pressure medicine 10/2014-it made her feel bad, nauseated.

## 2015-04-11 NOTE — ED Notes (Signed)
The patient said she started having chest pain last night and this morning.  She thought it would go away and it did not.  She did take aspirin and it eased it a little but then it got worse.  She rates her pain 7/10.  She denies any other symptoms.

## 2015-04-11 NOTE — ED Provider Notes (Signed)
CSN: [id number]     Arrival date & time 04/11/15  1741 History   First MD Initiated Contact with Patient 04/11/15 2130     Chief Complaint  Patient presents with  . Chest Pain    The patient said she started having chest pain last night and this morning.  She thought it would go away and it did not.  She did take aspirin and it eased it a little but then it got worse.     (Consider location/radiation/quality/duration/timing/severity/associated sxs/prior Treatment) Patient is a 30 y.o. female presenting with chest pain. The history is provided by the patient.  Chest Pain Pain location:  L chest Pain quality: sharp and tightness   Pain radiates to:  L shoulder, L arm and upper back Pain severity:  Mild Onset quality:  Gradual Duration: starting last night. Timing:  Constant Progression:  Waxing and waning Chronicity:  New Context: no movement   Relieved by:  Nothing Worsened by:  Nothing tried Ineffective treatments:  None tried Associated symptoms: no abdominal pain, no cough, no dizziness, no fatigue, no fever, no nausea, no shortness of breath and not vomiting   Risk factors: high cholesterol, hypertension and obesity     Past Medical History  Diagnosis Date  . Hypertension   . Migraines   . Hyperlipidemia 05/27/2011   History reviewed. No pertinent past surgical history. Family History  Problem Relation Age of Onset  . Hypertension Other   . Mental illness Other   . Hyperlipidemia Other   . Diabetes Other   . Cancer Other     prostate  . Sudden death Other   . Diabetes Other    Social History  Substance Use Topics  . Smoking status: Never Smoker   . Smokeless tobacco: None  . Alcohol Use: Yes     Comment: social   OB History    No data available     Review of Systems  Constitutional: Negative for fever and fatigue.  HENT: Negative for congestion.   Eyes: Negative for visual disturbance.  Respiratory: Negative for cough and shortness of breath.    Cardiovascular: Positive for chest pain.  Gastrointestinal: Negative for nausea, vomiting and abdominal pain.  Genitourinary: Negative.   Musculoskeletal: Negative.   Skin: Negative for pallor, rash and wound.  Neurological: Negative for dizziness.      Allergies  Penicillins  Home Medications   Prior to Admission medications   Medication Sig Start Date End Date Taking? Authorizing Provider  amLODipine (NORVASC) 5 MG tablet Take 1 tablet (5 mg total) by mouth daily. Patient not taking: Reported on 04/11/2015 10/18/13 04/11/15  Biagio Borg, MD  hydrochlorothiazide (HYDRODIURIL) 12.5 MG tablet Take 1 tablet (12.5 mg total) by mouth daily. Patient not taking: Reported on 04/11/2015 10/18/13   Biagio Borg, MD  lisinopril (PRINIVIL,ZESTRIL) 5 MG tablet Take 1 tablet (5 mg total) by mouth daily. 04/11/15   Heriberto Antigua, MD   BP 164/98 mmHg  Pulse 80  Temp(Src) 97.7 F (36.5 C) (Oral)  Resp 16  SpO2 100% Physical Exam  Constitutional: She is oriented to person, place, and time. She appears well-developed and well-nourished. No distress.  HENT:  Head: Normocephalic and atraumatic.  Mouth/Throat: No oropharyngeal exudate.  Eyes: EOM are normal. Pupils are equal, round, and reactive to light.  Neck: Normal range of motion. Neck supple.  Cardiovascular: Regular rhythm, normal heart sounds and intact distal pulses.   tachycardia  Pulmonary/Chest: Effort normal and breath sounds normal. No  respiratory distress. She exhibits no tenderness.  Abdominal: Soft. She exhibits no distension. There is no tenderness. There is no rebound and no guarding.  Musculoskeletal: Normal range of motion. She exhibits no edema or tenderness.  Neurological: She is alert and oriented to person, place, and time. No cranial nerve deficit. She exhibits normal muscle tone. Coordination normal.  Skin: Skin is warm and dry. No rash noted. She is not diaphoretic. No erythema. No pallor.  Psychiatric: She has a normal  mood and affect.  Nursing note and vitals reviewed.   ED Course  Procedures (including critical care time) Labs Review Labs Reviewed  CBC - Abnormal; Notable for the following:    WBC 13.0 (*)    RBC 5.69 (*)    Hemoglobin 17.6 (*)    HCT 51.3 (*)    All other components within normal limits  BASIC METABOLIC PANEL  D-DIMER, QUANTITATIVE (NOT AT [facility])  I-STAT TROPOININ, ED  I-STAT TROPOININ, ED    Imaging Review Dg Chest 2 View  04/11/2015  CLINICAL DATA:  Chest pain with tingling of left arm starting last night. EXAM: CHEST  2 VIEW COMPARISON:  CT chest November 19, 2011 FINDINGS: The heart size and mediastinal contours are within normal limits. There is a calcified granuloma in the right upper lobe unchanged compared to prior CT. There is no focal infiltrate, pulmonary edema, or pleural effusion. The visualized skeletal structures are unremarkable. IMPRESSION: No active cardiopulmonary disease. Electronically Signed   By: Abelardo Diesel M.D.   On: 04/11/2015 18:21   I have personally reviewed and evaluated these images and lab results as part of my medical decision-making.   EKG Interpretation   Date/Time:  Thursday April 11 2015 17:51:39 EST Ventricular Rate:  90 PR Interval:  132 QRS Duration: 82 QT Interval:  356 QTC Calculation: 435 R Axis:   99 Text Interpretation:  Normal sinus rhythm Biatrial enlargement Rightward  axis Abnormal ECG No significant change was found Confirmed by Wyvonnia Dusky   MD, STEPHEN [phone]) on 04/11/2015 9:57:35 PM      MDM   Final diagnoses:  Chest pain, unspecified chest pain type  Essential hypertension    Patient is a 30 year old female with a history of hypertension who presents with left-sided chest tightness from urgent care. She says this started over the night without associated shortness of breath, nausea, vomiting, or presyncopal symptoms. Further history and exam as above notable for slight tachycardia and hypertension. She states  that she has not been taking her medicines as prescribed. Patient has a negative delta troponin, negative d-dimer, reassuring BMP CBC and EKG without acute ischemic changes. Patient has a reassuring 4 extremity blood pressures here so doubt dissection or aortic pathology at this time. Patient advised about her hypertension and need to be on medication.  I have reviewed all labs and workup. Patient stable for discharge home.  I have reviewed all results with the patient. Advised to f/u with cardiology and her pcp. Will try lisinopril.. Patient agrees to stated plan. All questions answered. Advised to call or return to have any questions, new symptoms, change in symptoms, or symptoms that they do not understand.     Heriberto Antigua, MD 04/12/15 [place], MD 04/13/15 1055

## 2015-04-11 NOTE — Discharge Instructions (Signed)
Hypertension Hypertension, commonly called high blood pressure, is when the force of blood pumping through your arteries is too strong. Your arteries are the blood vessels that carry blood from your heart throughout your body. A blood pressure reading consists of a higher number over a lower number, such as 110/72. The higher number (systolic) is the pressure inside your arteries when your heart pumps. The lower number (diastolic) is the pressure inside your arteries when your heart relaxes. Ideally you want your blood pressure below 120/80. Hypertension forces your heart to work harder to pump blood. Your arteries may become narrow or stiff. Having untreated or uncontrolled hypertension can cause heart attack, stroke, kidney disease, and other problems. RISK FACTORS Some risk factors for high blood pressure are controllable. Others are not.  Risk factors you cannot control include:   Race. You may be at higher risk if you are African American.  Age. Risk increases with age.  Gender. Men are at higher risk than women before age 45 years. After age 65, women are at higher risk than men. Risk factors you can control include:  Not getting enough exercise or physical activity.  Being overweight.  Getting too much fat, sugar, calories, or salt in your diet.  Drinking too much alcohol. SIGNS AND SYMPTOMS Hypertension does not usually cause signs or symptoms. Extremely high blood pressure (hypertensive crisis) may cause headache, anxiety, shortness of breath, and nosebleed. DIAGNOSIS To check if you have hypertension, your health care provider will measure your blood pressure while you are seated, with your arm held at the level of your heart. It should be measured at least twice using the same arm. Certain conditions can cause a difference in blood pressure between your right and left arms. A blood pressure reading that is higher than normal on one occasion does not mean that you need treatment. If  it is not clear whether you have high blood pressure, you may be asked to return on a different day to have your blood pressure checked again. Or, you may be asked to monitor your blood pressure at home for 1 or more weeks. TREATMENT Treating high blood pressure includes making lifestyle changes and possibly taking medicine. Living a healthy lifestyle can help lower high blood pressure. You may need to change some of your habits. Lifestyle changes may include:  Following the DASH diet. This diet is high in fruits, vegetables, and whole grains. It is low in salt, red meat, and added sugars.  Keep your sodium intake below 2,300 mg per day.  Getting at least 30-45 minutes of aerobic exercise at least 4 times per week.  Losing weight if necessary.  Not smoking.  Limiting alcoholic beverages.  Learning ways to reduce stress. Your health care provider may prescribe medicine if lifestyle changes are not enough to get your blood pressure under control, and if one of the following is true:  You are 18-59 years of age and your systolic blood pressure is above 140.  You are 60 years of age or older, and your systolic blood pressure is above 150.  Your diastolic blood pressure is above 90.  You have diabetes, and your systolic blood pressure is over 140 or your diastolic blood pressure is over 90.  You have kidney disease and your blood pressure is above 140/90.  You have heart disease and your blood pressure is above 140/90. Your personal target blood pressure may vary depending on your medical conditions, your age, and other factors. HOME CARE INSTRUCTIONS    Have your blood pressure rechecked as directed by your health care provider.   Take medicines only as directed by your health care provider. Follow the directions carefully. Blood pressure medicines must be taken as prescribed. The medicine does not work as well when you skip doses. Skipping doses also puts you at risk for  problems.  Do not smoke.   Monitor your blood pressure at home as directed by your health care provider. SEEK MEDICAL CARE IF:   You think you are having a reaction to medicines taken.  You have recurrent headaches or feel dizzy.  You have swelling in your ankles.  You have trouble with your vision. SEEK IMMEDIATE MEDICAL CARE IF:  You develop a severe headache or confusion.  You have unusual weakness, numbness, or feel faint.  You have severe chest or abdominal pain.  You vomit repeatedly.  You have trouble breathing. MAKE SURE YOU:   Understand these instructions.  Will watch your condition.  Will get help right away if you are not doing well or get worse.   This information is not intended to replace advice given to you by your health care provider. Make sure you discuss any questions you have with your health care provider.   Document Released: 02/16/2005 Document Revised: 07/03/2014 Document Reviewed: 12/09/2012 Elsevier Interactive Patient Education 2016 Elsevier Inc.  

## 2015-04-17 ENCOUNTER — Encounter: Payer: Self-pay | Admitting: Cardiovascular Disease

## 2015-04-17 ENCOUNTER — Ambulatory Visit (INDEPENDENT_AMBULATORY_CARE_PROVIDER_SITE_OTHER): Payer: 59 | Admitting: Cardiovascular Disease

## 2015-04-17 ENCOUNTER — Telehealth: Payer: Self-pay | Admitting: Cardiovascular Disease

## 2015-04-17 VITALS — BP 166/118 | HR 96 | Ht 67.0 in | Wt 296.0 lb

## 2015-04-17 DIAGNOSIS — I119 Hypertensive heart disease without heart failure: Secondary | ICD-10-CM | POA: Diagnosis not present

## 2015-04-17 MED ORDER — HYDROCHLOROTHIAZIDE 12.5 MG PO TABS: 12.5000 mg | ORAL_TABLET | Freq: Every day | ORAL | Status: DC

## 2015-04-17 MED ORDER — LISINOPRIL 10 MG PO TABS: 10.0000 mg | ORAL_TABLET | Freq: Every day | ORAL | Status: DC

## 2015-04-17 NOTE — Telephone Encounter (Signed)
Pt informed refill with correct routing was resent to pharmacy.

## 2015-04-17 NOTE — Assessment & Plan Note (Signed)
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 She is severely overweight and probably doesn't. Adequate attention to her diet and salt intake. She was recently seen in the emergency room with chest pain and hypertension. She has been noncompliant with her outpatient antihypertensive regimen including amlodipine and hydrochlorothiazide because of side effects.  She does have symptoms compatible with obstructive sleep apnea. Talked about the importance of salt restriction, dietary modification and exercise. I'm going to get a 2-D echo to assess for LVH, increase her lisinopril from 5-10 mg a day. I've encouraged her to obtain a home blood pressure monitor and keep a blood pressure log over the next 30 days. She'll will see Erasmo Downer back in follow-up. I'm also going to arrange for her to undergo a outpatient sleep study.

## 2015-04-17 NOTE — Assessment & Plan Note (Signed)
History of mild hyperlipidemia with her last lipid profile documented on 10/18/13 revealed total cholesterol 185, LDL of 124 and HDL of 42. I suspect this is highly dietary  dependent.

## 2015-04-17 NOTE — Patient Instructions (Addendum)
Medication Instructions:  Your physician has recommended you make the following change in your medication:  1) STOP Norvasc 2) START Hydrocholrothiazide 12.5 mg by mouth ONCE daily 30 START Lisinopril 10 mg by mouth ONCE daily   Labwork: none  Testing/Procedures: Your physician has requested that you have an echocardiogram. Echocardiography is a painless test that uses sound waves to create images of your heart. It provides your doctor with information about the size and shape of your heart and how well your heart's chambers and valves are working. This procedure takes approximately one hour. There are no restrictions for this procedure.  Your physician has recommended that you have a sleep study. This test records several body functions during sleep, including: brain activity, eye movement, oxygen and carbon dioxide blood levels, heart rate and rhythm, breathing rate and rhythm, the flow of air through your mouth and nose, snoring, body muscle movements, and chest and belly movement.   Follow-Up: Your physician recommends that you schedule a follow-up appointment in:  1 month with [facility] Your physician recommends that you schedule a follow-up appointment in: 6-8 weeks with Dr. Gwenlyn Found  Dr. Gwenlyn Found would like you to check your blood pressure DAILY for the next 4 weeks.  Keep a journal of these daily BP and heart rate reading and call our office with the results.    Any Other Special Instructions Will Be Listed Below (If Applicable).     If you need a refill on your cardiac medications before your next appointment, please call your pharmacy.

## 2015-04-17 NOTE — Progress Notes (Signed)
04/17/2015 Jennifer Allison   Apr 26, 1985  [id number]  Primary Physician Cathlean Cower, MD Primary Cardiologist: Lorretta Harp MD Renae Gloss   HPI:  Jennifer Allison is a 30 year old severely overweight single African-American female referred by the emergency room for treatment of chest pain and hypertension. Jennifer Allison primary care physician is Dr. Cecilio Asper. 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 Jennifer Allison only cardiac risk factor is hypertension which she's had for 4 years. She recently has begun working with a Dietitian" he was advising Jennifer Allison regarding diet and exercise. She apparently was not compliant with Jennifer Allison antihypertensive medications because of side effects (lisinopril, hydrochlorothiazide, amlodipine). She also describes symptoms compatible with obstructive sleep apnea. She was seen in the emergency room recently with atypical chest pain with radiation down Jennifer Allison left arm and blood pressures that were significantly elevated. Jennifer Allison workup was unremarkable. I'm going to increase Jennifer Allison lisinopril from 5-10 mg a day. I've encouraged Jennifer Allison to obtain outpatient blood pressure monitor and keep daily record of this. We'll obtain an outpatient sleep study. She may need  Further antihypertensive medication titration and/or addition. I talked about the importance of salt reduction, diet, exercise and weight reduction.   Current Outpatient Prescriptions  Medication Sig Dispense Refill  . lisinopril (PRINIVIL,ZESTRIL) 5 MG tablet Take 1 tablet (5 mg total) by mouth daily. 10 tablet 0  . hydrochlorothiazide (HYDRODIURIL) 12.5 MG tablet Take 1 tablet (12.5 mg total) by mouth daily. (Patient not taking: Reported on 04/11/2015) 90 tablet 3   No current facility-administered medications for this visit.    Allergies  Allergen Reactions  . Penicillins Hives    Social History   Social History  . Marital  Status: Single    Spouse Name: N/A  . Number of Children: N/A  . Years of Education: 16   Occupational History  . 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 Smoking status: Never Smoker   . Smokeless tobacco: Never Used  . Alcohol Use: Yes     Comment: social  . Drug Use: No  . Sexual Activity: Not on file   Other Topics Concern  . Not on file   Social History Narrative     Review of Systems: General: negative for chills, fever, night sweats or weight changes.  Cardiovascular: negative for chest pain, dyspnea on exertion, edema, orthopnea, palpitations, paroxysmal nocturnal dyspnea or shortness of breath Dermatological: negative for rash Respiratory: negative for cough or wheezing Urologic: negative for hematuria Abdominal: negative for nausea, vomiting, diarrhea, bright red blood per rectum, melena, or hematemesis Neurologic: negative for visual changes, syncope, or dizziness All other systems reviewed and are otherwise negative except as noted above.    Blood pressure 166/118, pulse 96, height 5\' 7"  (1.702 m), weight 296 lb (134.265 kg).  General appearance: alert and no distress Neck: no adenopathy, no carotid bruit, no JVD, supple, symmetrical, trachea midline and thyroid not enlarged, symmetric, no tenderness/mass/nodules Lungs: clear to auscultation bilaterally Heart: regular rate and rhythm, S1, S2 normal, no murmur, click, rub or gallop Extremities: extremities normal, atraumatic, no cyanosis or edema  EKG sinus rhythm at 96 without ST or T-wave changes. 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 She is severely overweight and probably doesn't. Adequate attention to Jennifer Allison diet and salt intake. She was  recently seen in the emergency room with chest pain and hypertension. She has been noncompliant with Jennifer Allison outpatient antihypertensive regimen including amlodipine and  hydrochlorothiazide because of side effects.  She does have symptoms compatible with obstructive sleep apnea. Talked about the importance of salt restriction, dietary modification and exercise. I'm going to get a 2-D echo to assess for LVH, increase Jennifer Allison lisinopril from 5-10 mg a day. I've encouraged Jennifer Allison to obtain a home blood pressure monitor and keep a blood pressure log over the next 30 days. She'll will see Erasmo Downer back in follow-up. I'm also going to arrange for Jennifer Allison to undergo a outpatient sleep study.  Hyperlipidemia History of mild hyperlipidemia with Jennifer Allison last lipid profile documented on 10/18/13 revealed total cholesterol 185, LDL of 124 and HDL of 42. I suspect this is highly dietary  dependent.      Lorretta Harp MD FACP,FACC,FAHA, [facility] 04/17/2015 10:41 AM

## 2015-04-17 NOTE — Telephone Encounter (Signed)
Follow Up  Pt c/o medication issue:  1. Name of Medication:hydrochlorothiazide (HYDRODIURIL) 12.5 MG tablet  4. What is your medication issue? Pt states she went to the pharmacy to pick up scripts. Lisinopril was there but not the hydrochlorothiazide (HYDRODIURIL) 12.5 MG tablet. Please call this medication in as well.

## 2015-04-18 ENCOUNTER — Telehealth: Payer: Self-pay | Admitting: Cardiovascular Disease

## 2015-04-18 NOTE — Telephone Encounter (Signed)
Spoke to patient Patient states she has a  cough , little phlegm.cough statred yesterday Patient has had only 2 doses of lisinopril  RN spoke to pharmacist -Erasmo Downer  per Erasmo Downer ,hold lisinopril for 2 days then restart may use cough syrup nothing with decongestant Call office if cough returns  Patient verbalized uderstanding

## 2015-04-18 NOTE — Telephone Encounter (Signed)
New message      Pt c/o medication issue:  1. Name of Medication: lisinopril 2. How are you currently taking this medication (dosage and times per day)? 10mg  daily 3. Are you having a reaction (difficulty breathing--STAT)? no  4. What is your medication issue? Pt started taking medication yesterday.  She had developed a cough. Please advise

## 2015-04-29 ENCOUNTER — Ambulatory Visit (HOSPITAL_COMMUNITY): Payer: 59 | Attending: Internal Medicine

## 2015-04-29 ENCOUNTER — Other Ambulatory Visit: Payer: Self-pay

## 2015-04-29 DIAGNOSIS — I119 Hypertensive heart disease without heart failure: Secondary | ICD-10-CM | POA: Diagnosis not present

## 2015-04-29 DIAGNOSIS — E785 Hyperlipidemia, unspecified: Secondary | ICD-10-CM | POA: Insufficient documentation

## 2015-04-29 DIAGNOSIS — R002 Palpitations: Secondary | ICD-10-CM | POA: Insufficient documentation

## 2015-05-16 ENCOUNTER — Ambulatory Visit: Payer: 59 | Admitting: Pharmacist Clinician (PhC)/ Clinical Pharmacy Specialist

## 2015-05-20 ENCOUNTER — Ambulatory Visit: Payer: 59 | Admitting: Pharmacist Clinician (PhC)/ Clinical Pharmacy Specialist

## 2015-06-06 ENCOUNTER — Telehealth: Payer: Self-pay | Admitting: *Deleted

## 2015-06-06 ENCOUNTER — Other Ambulatory Visit: Payer: Self-pay | Admitting: *Deleted

## 2015-06-06 DIAGNOSIS — G4700 Insomnia, unspecified: Secondary | ICD-10-CM

## 2015-06-06 DIAGNOSIS — R0683 Snoring: Secondary | ICD-10-CM

## 2015-06-06 DIAGNOSIS — Z0189 Encounter for other specified special examinations: Secondary | ICD-10-CM

## 2015-06-06 NOTE — Telephone Encounter (Signed)
Left message to call me regarding sleep study.  Dr. Gwenlyn Found had ordered a PSG, insurance denied lab study.   Need to see if patient is willing to do a home sleep study.

## 2015-06-09 ENCOUNTER — Encounter (HOSPITAL_BASED_OUTPATIENT_CLINIC_OR_DEPARTMENT_OTHER): Payer: 59

## 2015-06-12 ENCOUNTER — Telehealth: Payer: Self-pay | Admitting: Cardiovascular Disease

## 2015-06-12 NOTE — Telephone Encounter (Signed)
Br Berry's 04/17/15 office note was sent along with patient's insurance and demographic page to [place] [facility].

## 2015-06-12 NOTE — Telephone Encounter (Signed)
New message   Jennifer Allison is calling because he has no insurance information and no office notes  In order to do the home study for the pt  Please give office a call

## 2015-06-25 ENCOUNTER — Ambulatory Visit (INDEPENDENT_AMBULATORY_CARE_PROVIDER_SITE_OTHER): Payer: 59 | Admitting: Cardiovascular Disease

## 2015-06-25 ENCOUNTER — Encounter: Payer: Self-pay | Admitting: Cardiovascular Disease

## 2015-06-25 VITALS — BP 136/96 | HR 99 | Ht 67.0 in | Wt 285.0 lb

## 2015-06-25 DIAGNOSIS — I10 Essential (primary) hypertension: Secondary | ICD-10-CM | POA: Diagnosis not present

## 2015-06-25 NOTE — Patient Instructions (Signed)

## 2015-06-25 NOTE — Assessment & Plan Note (Signed)
History of hypertension blood pressure measured 136/96 although when she measures it at home it's significantly better than this. She is on hydrochlorothiazide and lisinopril. She feels clinically improved. Continue current meds at current dosing

## 2015-06-25 NOTE — Progress Notes (Signed)
History of hypertension blood pressure measured 136/96 although when she measures it at home it's significantly better than this. She is on hydrochlorothiazide and lisinopril. She feels clinically improved. Continue current meds at current dosing

## 2015-07-08 ENCOUNTER — Other Ambulatory Visit: Payer: Self-pay

## 2015-07-08 DIAGNOSIS — I119 Hypertensive heart disease without heart failure: Secondary | ICD-10-CM

## 2015-07-08 MED ORDER — HYDROCHLOROTHIAZIDE 12.5 MG PO TABS: 12.5000 mg | ORAL_TABLET | Freq: Every day | ORAL | Status: DC

## 2015-07-08 NOTE — Telephone Encounter (Signed)
Rx Refill

## 2015-07-10 ENCOUNTER — Other Ambulatory Visit: Payer: Self-pay | Admitting: *Deleted

## 2015-07-10 DIAGNOSIS — I119 Hypertensive heart disease without heart failure: Secondary | ICD-10-CM

## 2015-07-10 MED ORDER — HYDROCHLOROTHIAZIDE 12.5 MG PO TABS: 12.5000 mg | ORAL_TABLET | Freq: Every day | ORAL | Status: DC

## 2015-07-10 MED ORDER — LISINOPRIL 10 MG PO TABS: 10.0000 mg | ORAL_TABLET | Freq: Every day | ORAL | Status: DC

## 2016-08-20 ENCOUNTER — Other Ambulatory Visit: Payer: Self-pay | Admitting: Cardiovascular Disease

## 2016-08-20 DIAGNOSIS — I119 Hypertensive heart disease without heart failure: Secondary | ICD-10-CM

## 2016-12-22 ENCOUNTER — Ambulatory Visit (INDEPENDENT_AMBULATORY_CARE_PROVIDER_SITE_OTHER): Payer: 59 | Admitting: Cardiovascular Disease

## 2016-12-22 ENCOUNTER — Encounter: Payer: Self-pay | Admitting: Cardiovascular Disease

## 2016-12-22 VITALS — BP 142/98 | HR 100 | Ht 67.0 in | Wt 311.8 lb

## 2016-12-22 DIAGNOSIS — E7800 Pure hypercholesterolemia, unspecified: Secondary | ICD-10-CM | POA: Diagnosis not present

## 2016-12-22 DIAGNOSIS — Z79899 Other long term (current) drug therapy: Secondary | ICD-10-CM

## 2016-12-22 DIAGNOSIS — I119 Hypertensive heart disease without heart failure: Secondary | ICD-10-CM

## 2016-12-22 DIAGNOSIS — I10 Essential (primary) hypertension: Secondary | ICD-10-CM | POA: Diagnosis not present

## 2016-12-22 MED ORDER — LISINOPRIL 20 MG PO TABS: 20.0000 mg | ORAL_TABLET | Freq: Every day | ORAL | 5 refills | Status: DC

## 2016-12-22 NOTE — Assessment & Plan Note (Signed)
History of hyperlipidemia not on statin therapy. Her last lipid profile performed 3 years ago revealed an LDL of 124.

## 2016-12-22 NOTE — Progress Notes (Signed)
12/22/2016 Jennifer Allison   11/22/1985  [id number]  Primary Physician Biagio Borg, MD Primary Cardiologist: Lorretta Harp MD Lupe Carney, [place]  HPI:  Jennifer Allison is a 31 y.o.   severely overweight single African-American female referred by the emergency room for treatment of chest pain and hypertension. Her primary care physician is Dr. Cathlean Cower . 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 Her only cardiac risk factor is hypertension which she's had for 4 years. She recently has begun working with a Dietitian" he was advising her regarding diet and exercise. She apparently was not compliant with her antihypertensive medications because of side effects (lisinopril, hydrochlorothiazide, amlodipine). She also describes symptoms compatible with obstructive sleep apnea. She was seen in the emergency room recently with atypical chest pain with radiation down her left arm and blood pressures that were significantly elevated. Her workup was unremarkable. I'm  increased her lisinopril from 5-10 mg a day. I've encouraged her to obtain outpatient blood pressure monitor and keep daily record of this which she did. I wanted to obtain outpatient sleep study which was never done because of cost constraints. She may need  further antihypertensive medication titration and/or addition. I talked about the importance of salt reduction, diet, exercise and weight reduction. Since I saw her a year and a half ago she has gained 25 pounds.  She stopped seeing her coach until recently.    Current Meds  Medication Sig  . hydrochlorothiazide (HYDRODIURIL) 12.5 MG tablet TAKE 1 TABLET BY MOUTH  DAILY  . lisinopril (PRINIVIL,ZESTRIL) 10 MG tablet TAKE 1 TABLET BY MOUTH  DAILY     Allergies  Allergen Reactions  . Penicillins Hives    Social History   Social History  . Marital status: Single   Spouse name: N/A  . Number of children: N/A  . Years of education: 69   Occupational History  . 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 Smoking status: Never Smoker  . Smokeless tobacco: Never Used  . Alcohol use Yes     Comment: social  . Drug use: No  . Sexual activity: Not on file   Other Topics Concern  . Not on file   Social History Narrative  . No narrative on file     Review of Systems: General: negative for chills, fever, night sweats or weight changes.  Cardiovascular: negative for chest pain, dyspnea on exertion, edema, orthopnea, palpitations, paroxysmal nocturnal dyspnea or shortness of breath Dermatological: negative for rash Respiratory: negative for cough or wheezing Urologic: negative for hematuria Abdominal: negative for nausea, vomiting, diarrhea, bright red blood per rectum, melena, or hematemesis Neurologic: negative for visual changes, syncope, or dizziness All other systems reviewed and are otherwise negative except as noted above.    Blood pressure (!) 142/98, pulse 100, height 5\' 7"  (1.702 m), weight (!) 311 lb 12.8 oz (141.4 kg).  General appearance: alert and no distress Neck: no adenopathy, no carotid bruit, no JVD, supple, symmetrical, trachea midline and thyroid not enlarged, symmetric, no tenderness/mass/nodules Lungs: clear to auscultation bilaterally Heart: regular rate and rhythm, S1, S2 normal, no murmur, click, rub or gallop Extremities: extremities normal, atraumatic, no cyanosis or edema Pulses: 2+ and symmetric Skin: Skin color, texture, turgor normal. No rashes or lesions Neurologic: Alert and oriented X 3, normal strength and tone. Normal symmetric reflexes. Normal coordination  and gait  EKG sinus tachycardia 100 with nonspecific ST and T-wave changes. I personally reviewed this EKG.  ASSESSMENT AND PLAN:   HTN (hypertension) History of essential hypertension blood pressure measured 142/98.She does have a home blood  pressure cuff and says her blood pressure runs consistently high. She is on hydrochlorothiazide and low-dose lisinopril. She does not eat salt. She has gained 25 pounds in the last year or so. She has not had an outpatient sleep study. We talked about the importance of diet and exercise, weight loss. I'm referring her to a dietitian to discuss dietary modification. I'm going to increase her lisinopril to 20 mg a day. We'll get routine labs today. She'll see him back in 2 weeks for follow-up and review of blood pressure log she'll keep every day. I will see her back in 3 months.  Hyperlipidemia History of hyperlipidemia not on statin therapy. Her last lipid profile performed 3 years ago revealed an LDL of 124.      Lorretta Harp MD FACP,FACC,FAHA, [facility] 12/22/2016 4:01 PM

## 2016-12-22 NOTE — Assessment & Plan Note (Signed)
History of essential hypertension blood pressure measured 142/98.She does have a home blood pressure cuff and says her blood pressure runs consistently high. She is on hydrochlorothiazide and low-dose lisinopril. She does not eat salt. She has gained 25 pounds in the last year or so. She has not had an outpatient sleep study. We talked about the importance of diet and exercise, weight loss. I'm referring her to a dietitian to discuss dietary modification. I'm going to increase her lisinopril to 20 mg a day. We'll get routine labs today. She'll see him back in 2 weeks for follow-up and review of blood pressure log she'll keep every day. I will see her back in 3 months.

## 2016-12-22 NOTE — Patient Instructions (Addendum)
Medication Instructions:   INCREASE lisinopril to 20mg  daily  Labwork:  TODAY - CBC, BMET, TSH, Free T4  Testing/Procedures:  NONE  Follow-Up:  Your physician recommends that you schedule a follow-up appointment in 2 weeks for a blood pressure check with clinical pharmacist  Your physician recommends that you schedule a follow-up appointment in: [place] with Dr. Andria Rhein have been referred to dietician [place]) [phone] -- ask for weight loss program     If you need a refill on your cardiac medications before your next appointment, please call your pharmacy.  Any Other Special Instructions Will Be Listed Below (If Applicable).

## 2016-12-23 LAB — BASIC METABOLIC PANEL
BUN/Creatinine Ratio: 11 (ref 9–23)
BUN: 9 mg/dL (ref 6–20)
CALCIUM: 10.6 mg/dL — AB (ref 8.7–10.2)
CO2: 19 mmol/L — AB (ref 20–29)
CREATININE: 0.84 mg/dL (ref 0.57–1.00)
Chloride: 101 mmol/L (ref 96–106)
GFR calc Af Amer: 107 mL/min/{1.73_m2} (ref >59)
GFR, EST NON AFRICAN AMERICAN: 93 mL/min/{1.73_m2} (ref >59)
Glucose: 81 mg/dL (ref 65–99)
POTASSIUM: 4.3 mmol/L (ref 3.5–5.2)
Sodium: 139 mmol/L (ref 134–144)

## 2016-12-23 LAB — CBC
HEMATOCRIT: 50.5 % — AB (ref 34.0–46.6)
Hemoglobin: 17.1 g/dL — ABNORMAL HIGH (ref 11.1–15.9)
MCH: 30.5 pg (ref 26.6–33.0)
MCHC: 33.9 g/dL (ref 31.5–35.7)
MCV: 90 fL (ref 79–97)
Platelets: 282 10*3/uL (ref 150–379)
RBC: 5.6 x10E6/uL — ABNORMAL HIGH (ref 3.77–5.28)
RDW: 13.1 % (ref 12.3–15.4)
WBC: 13 10*3/uL — AB (ref 3.4–10.8)

## 2016-12-23 LAB — TSH: TSH: 2.1 u[IU]/mL (ref 0.450–4.500)

## 2016-12-23 LAB — T4, FREE: Free T4: 1.26 ng/dL (ref 0.82–1.77)

## 2016-12-28 ENCOUNTER — Telehealth: Payer: Self-pay

## 2016-12-28 NOTE — Telephone Encounter (Signed)
Have tried to contact pt multiple times, no returned call to discuss.  Notes recorded by Juliet Rude, CMA on 12/28/2016 at 8:58 AM EDT Called pt, LVM ------  Notes recorded by Juliet Rude, CMA on 12/26/2016 at 9:34 AM EDT Called pt, LVM ------  Notes recorded by Juliet Rude, CMA on 12/25/2016 at 4:29 PM EDT Called pt, LVM ------

## 2016-12-28 NOTE — Telephone Encounter (Signed)
-----   Message from Biagio Borg, MD sent at 12/25/2016  3:36 PM EDT ----- Jennifer Allison to contact pt; please ask to make appt as she has not been seen since 2015, and we'll need to further evaluate the elevated Hgb and wbc, thanks

## 2017-01-01 NOTE — Telephone Encounter (Signed)
Patient has set up an appointment for Nov 13.

## 2017-01-06 ENCOUNTER — Ambulatory Visit (INDEPENDENT_AMBULATORY_CARE_PROVIDER_SITE_OTHER): Payer: 59 | Admitting: Pharmacist

## 2017-01-06 VITALS — BP 118/78 | HR 84

## 2017-01-06 DIAGNOSIS — I119 Hypertensive heart disease without heart failure: Secondary | ICD-10-CM | POA: Diagnosis not present

## 2017-01-06 DIAGNOSIS — I10 Essential (primary) hypertension: Secondary | ICD-10-CM | POA: Diagnosis not present

## 2017-01-06 MED ORDER — LISINOPRIL 20 MG PO TABS
20.0000 mg | ORAL_TABLET | Freq: Every day | ORAL | 1 refills | Status: DC
Start: 2017-01-06 — End: 2017-11-08

## 2017-01-06 NOTE — Patient Instructions (Addendum)
Return for a a follow up appointment in 6 weeks  Your blood pressure today is 118/78 pulse 84  Check your blood pressure at home daily (if able) and keep record of the readings.  Take your BP meds as follows: **ALL medication as previously prescribed*  Bring all your BP cuff and your record of home blood pressures to your next appointment.  Exercise as you're able, try to walk approximately 30 minutes per day.  Keep salt intake to a minimum, especially watch canned and prepared boxed foods.  Eat more fresh fruits and vegetables and fewer canned items.  Avoid eating in fast food restaurants.    HOW TO TAKE YOUR BLOOD PRESSURE: . Rest 5 minutes before taking your blood pressure. .  Don't smoke or drink caffeinated beverages for at least 30 minutes before. . Take your blood pressure before (not after) you eat. . Sit comfortably with your back supported and both feet on the floor (don't cross your legs). . Elevate your arm to heart level on a table or a desk. . Use the proper sized cuff. It should fit smoothly and snugly around your bare upper arm. There should be enough room to slip a fingertip under the cuff. The bottom edge of the cuff should be 1 inch above the crease of the elbow. . Ideally, take 3 measurements at one sitting and record the average.       DASH Eating Plan DASH stands for "Dietary Approaches to Stop Hypertension." The DASH eating plan is a healthy eating plan that has been shown to reduce high blood pressure (hypertension). It may also reduce your risk for type 2 diabetes, heart disease, and stroke. The DASH eating plan may also help with weight loss. What are tips for following this plan? General guidelines  Avoid eating more than 2,300 mg (milligrams) of salt (sodium) a day. If you have hypertension, you may need to reduce your sodium intake to 1,500 mg a day.  Limit alcohol intake to no more than 1 drink a day for nonpregnant women and 2 drinks a day for men. One  drink equals 12 oz of beer, 5 oz of wine, or 1 oz of hard liquor.  Work with your health care provider to maintain a healthy body weight or to lose weight. Ask what an ideal weight is for you.  Get at least 30 minutes of exercise that causes your heart to beat faster (aerobic exercise) most days of the week. Activities may include walking, swimming, or biking.  Work with your health care provider or diet and nutrition specialist (dietitian) to adjust your eating plan to your individual calorie needs. Reading food labels  Check food labels for the amount of sodium per serving. Choose foods with less than 5 percent of the Daily Value of sodium. Generally, foods with less than 300 mg of sodium per serving fit into this eating plan.  To find whole grains, look for the word "whole" as the first word in the ingredient list. Shopping  Buy products labeled as "low-sodium" or "no salt added."  Buy fresh foods. Avoid canned foods and premade or frozen meals. Cooking  Avoid adding salt when cooking. Use salt-free seasonings or herbs instead of table salt or sea salt. Check with your health care provider or pharmacist before using salt substitutes.  Do not fry foods. Cook foods using healthy methods such as baking, boiling, grilling, and broiling instead.  Cook with heart-healthy oils, such as olive, canola, soybean, or sunflower oil.  Meal planning   Eat a balanced diet that includes: ? 5 or more servings of fruits and vegetables each day. At each meal, try to fill half of your plate with fruits and vegetables. ? Up to 6-8 servings of whole grains each day. ? Less than 6 oz of lean meat, poultry, or fish each day. A 3-oz serving of meat is about the same size as a deck of cards. One egg equals 1 oz. ? 2 servings of low-fat dairy each day. ? A serving of nuts, seeds, or beans 5 times each week. ? Heart-healthy fats. Healthy fats called Omega-3 fatty acids are found in foods such as flaxseeds and  coldwater fish, like sardines, salmon, and mackerel.  Limit how much you eat of the following: ? Canned or prepackaged foods. ? Food that is high in trans fat, such as fried foods. ? Food that is high in saturated fat, such as fatty meat. ? Sweets, desserts, sugary drinks, and other foods with added sugar. ? Full-fat dairy products.  Do not salt foods before eating.  Try to eat at least 2 vegetarian meals each week.  Eat more home-cooked food and less restaurant, buffet, and fast food.  When eating at a restaurant, ask that your food be prepared with less salt or no salt, if possible. What foods are recommended? The items listed may not be a complete list. Talk with your dietitian about what dietary choices are best for you. Grains Whole-grain or whole-wheat bread. Whole-grain or whole-wheat pasta. Brown rice. [place]ena Morrow. Bulgur. Whole-grain and low-sodium cereals. Pita bread. Low-fat, low-sodium crackers. Whole-wheat flour tortillas. Vegetables Fresh or frozen vegetables (raw, steamed, roasted, or grilled). Low-sodium or reduced-sodium tomato and vegetable juice. Low-sodium or reduced-sodium tomato sauce and tomato paste. Low-sodium or reduced-sodium canned vegetables. Fruits All fresh, dried, or frozen fruit. Canned fruit in natural juice (without added sugar). Meat and other protein foods Skinless chicken or [place]. Ground chicken or [place]. Pork with fat trimmed off. Fish and seafood. Egg whites. Dried beans, peas, or lentils. Unsalted nuts, nut butters, and seeds. Unsalted canned beans. Lean cuts of beef with fat trimmed off. Low-sodium, lean deli meat. Dairy Low-fat (1%) or fat-free (skim) milk. Fat-free, low-fat, or reduced-fat cheeses. Nonfat, low-sodium ricotta or cottage cheese. Low-fat or nonfat yogurt. Low-fat, low-sodium cheese. Fats and oils Soft margarine without trans fats. Vegetable oil. Low-fat, reduced-fat, or light mayonnaise and salad dressings (reduced-sodium).  Canola, safflower, olive, soybean, and sunflower oils. Avocado. Seasoning and other foods Herbs. Spices. Seasoning mixes without salt. Unsalted popcorn and pretzels. Fat-free sweets. What foods are not recommended? The items listed may not be a complete list. Talk with your dietitian about what dietary choices are best for you. Grains Baked goods made with fat, such as croissants, muffins, or some breads. Dry pasta or rice meal packs. Vegetables Creamed or fried vegetables. Vegetables in a cheese sauce. Regular canned vegetables (not low-sodium or reduced-sodium). Regular canned tomato sauce and paste (not low-sodium or reduced-sodium). Regular tomato and vegetable juice (not low-sodium or reduced-sodium). Angie Fava. Olives. Fruits Canned fruit in a light or heavy syrup. Fried fruit. Fruit in cream or butter sauce. Meat and other protein foods Fatty cuts of meat. Ribs. Fried meat. Berniece Salines. Sausage. Bologna and other processed lunch meats. Salami. Fatback. Hotdogs. Bratwurst. Salted nuts and seeds. Canned beans with added salt. Canned or smoked fish. Whole eggs or egg yolks. Chicken or [place] with skin. Dairy Whole or 2% milk, cream, and half-and-half. Whole or full-fat cream cheese.  Whole-fat or sweetened yogurt. Full-fat cheese. Nondairy creamers. Whipped toppings. Processed cheese and cheese spreads. Fats and oils Butter. Stick margarine. Lard. Shortening. Ghee. Bacon fat. Tropical oils, such as coconut, palm kernel, or palm oil. Seasoning and other foods Salted popcorn and pretzels. Onion salt, garlic salt, seasoned salt, table salt, and sea salt. Worcestershire sauce. Tartar sauce. Barbecue sauce. Teriyaki sauce. Soy sauce, including reduced-sodium. Steak sauce. Canned and packaged gravies. Fish sauce. Oyster sauce. Cocktail sauce. Horseradish that you find on the shelf. Ketchup. Mustard. Meat flavorings and tenderizers. Bouillon cubes. Hot sauce and Tabasco sauce. Premade or packaged marinades.  Premade or packaged taco seasonings. Relishes. Regular salad dressings. Where to find more information:  National Heart, Lung, and [place]: https://wilson-eaton.com/  American Heart Association: www.heart.org Summary  The DASH eating plan is a healthy eating plan that has been shown to reduce high blood pressure (hypertension). It may also reduce your risk for type 2 diabetes, heart disease, and stroke.  With the DASH eating plan, you should limit salt (sodium) intake to 2,300 mg a day. If you have hypertension, you may need to reduce your sodium intake to 1,500 mg a day.  When on the DASH eating plan, aim to eat more fresh fruits and vegetables, whole grains, lean proteins, low-fat dairy, and heart-healthy fats.  Work with your health care provider or diet and nutrition specialist (dietitian) to adjust your eating plan to your individual calorie needs. This information is not intended to replace advice given to you by your health care provider. Make sure you discuss any questions you have with your health care provider. Document Released: 02/05/2011 Document Revised: 02/10/2016 Document Reviewed: 02/10/2016 Elsevier Interactive Patient Education  2017 [employer].

## 2017-01-06 NOTE — Assessment & Plan Note (Signed)
Blood pressure at goal today during office visit (patient was left alone in quite room for 2 minutes before taking vitals). Only 4 home BP readings were provided and I am not able to properly assess home BP control.    During this appointment we discussed: DASH eating plan, hydration, exercise, medication compliance, and long term consequences of elevated BP.  Will continue current medication without changes, continue working on lifestyle modifications and start daily BP monitoring. Patient should bring BP records to next f/u appointment in 6 weeks to determine in further adjustment in therapy is needed. BMET repeat done today after Lisinopril dose increased 2 weeks ago.

## 2017-01-06 NOTE — Progress Notes (Signed)
Patient ID: Jennifer Allison                 DOB: 10/17/1985                      MRN: [id number]     HPI: Jennifer Allison is a 31 y.o. female referred by Dr. Gwenlyn Found to HTN clinic. PMH include chest pain and hypertension.  She is currently working with Brewing technologist for diet and weight  Management. Patient currently exercising 3-4 times per week at the gym with mainly aerobic exercises and f/u with coach for weekly weight-in. Denies problems with headaches, chest pain, swelling or dizziness. Reports a slight increase in fatigue but getting better and not affecting daily activities.   Current HTN meds:  Lisinopril 20mg  daily HCTZ 12.5mg  daily  Previously tried:  Amlodipine 5mg  - very dizzy and severe HA Diltiazem 180mg  daily - unable to tolerate due to severe HA HCTZ 25mg   - unknown reason stop  BP goal: <130/80  Family History: father with high blood pressure  Social History: occasional alcohol intake; denies tobacco or other illicit drugs, drinks 3-4 cups of regular coffee per day  Diet: avoid adding salt to food but not following any low sodium plan at this time. Ingesting high protein , low carbs diet. Also report significant decrease in soda intake.  Exercise: gym 3-4x/ week  Home BP readings: only 4 readings provided  118/76; 150/93; 152/96;142/89  Wt Readings from Last 3 Encounters:  12/22/16 (!) 311 lb 12.8 oz (141.4 kg)  06/25/15 285 lb (129.3 kg)  04/17/15 296 lb (134.3 kg)   BP Readings from Last 3 Encounters:  01/06/17 118/78  12/22/16 (!) 142/98  06/25/15 (!) 136/96   Pulse Readings from Last 3 Encounters:  01/06/17 84  12/22/16 100  06/25/15 99    Past Medical History:  Diagnosis Date  . Hyperlipidemia 05/27/2011  . Hypertension   . Migraines   . Morbid obesity ([place])     Current Outpatient Medications on File Prior to Visit  Medication Sig Dispense Refill  . hydrochlorothiazide (HYDRODIURIL) 12.5 MG tablet TAKE 1 TABLET BY MOUTH  DAILY 30 tablet 0    No current facility-administered medications on file prior to visit.     Allergies  Allergen Reactions  . Penicillins Hives    Blood pressure 118/78, pulse 84, SpO2 97 %.  HTN (hypertension) Blood pressure at goal today during office visit (patient was left alone in quite room for 2 minutes before taking vitals). Only 4 home BP readings were provided and I am not able to properly assess home BP control.    During this appointment we discussed: DASH eating plan, hydration, exercise, medication compliance, and long term consequences of elevated BP.  Will continue current medication without changes, continue working on lifestyle modifications and start daily BP monitoring. Patient should bring BP records to next f/u appointment in 6 weeks to determine in further adjustment in therapy is needed. BMET repeat done today after Lisinopril dose increased 2 weeks ago.  Tysean Vandervliet Rodriguez-Guzman PharmD, BCPS, [place] [address] [place] [address] 01/06/2017 8:38 PM

## 2017-01-07 ENCOUNTER — Encounter: Payer: Self-pay | Admitting: Pharmacist

## 2017-01-07 LAB — BASIC METABOLIC PANEL
BUN/Creatinine Ratio: 13 (ref 9–23)
BUN: 11 mg/dL (ref 6–20)
CO2: 19 mmol/L — AB (ref 20–29)
Calcium: 10.1 mg/dL (ref 8.7–10.2)
Chloride: 100 mmol/L (ref 96–106)
Creatinine, Ser: 0.85 mg/dL (ref 0.57–1.00)
GFR calc Af Amer: 106 mL/min/{1.73_m2} (ref >59)
GFR, EST NON AFRICAN AMERICAN: 92 mL/min/{1.73_m2} (ref >59)
GLUCOSE: 72 mg/dL (ref 65–99)
Potassium: 4.1 mmol/L (ref 3.5–5.2)
SODIUM: 138 mmol/L (ref 134–144)

## 2017-01-12 ENCOUNTER — Encounter: Payer: Self-pay | Admitting: Internal Medicine

## 2017-01-12 ENCOUNTER — Other Ambulatory Visit (INDEPENDENT_AMBULATORY_CARE_PROVIDER_SITE_OTHER): Payer: 59

## 2017-01-12 ENCOUNTER — Ambulatory Visit (INDEPENDENT_AMBULATORY_CARE_PROVIDER_SITE_OTHER): Payer: 59 | Admitting: Internal Medicine

## 2017-01-12 VITALS — BP 132/86 | HR 87 | Temp 98.3°F | Ht 67.0 in | Wt 310.0 lb

## 2017-01-12 DIAGNOSIS — R400 Somnolence: Secondary | ICD-10-CM | POA: Diagnosis not present

## 2017-01-12 DIAGNOSIS — D72828 Other elevated white blood cell count: Secondary | ICD-10-CM | POA: Diagnosis not present

## 2017-01-12 DIAGNOSIS — D751 Secondary polycythemia: Secondary | ICD-10-CM

## 2017-01-12 DIAGNOSIS — Z0000 Encounter for general adult medical examination without abnormal findings: Secondary | ICD-10-CM

## 2017-01-12 DIAGNOSIS — Z114 Encounter for screening for human immunodeficiency virus [HIV]: Secondary | ICD-10-CM | POA: Diagnosis not present

## 2017-01-12 DIAGNOSIS — D72829 Elevated white blood cell count, unspecified: Secondary | ICD-10-CM | POA: Insufficient documentation

## 2017-01-12 DIAGNOSIS — F419 Anxiety disorder, unspecified: Secondary | ICD-10-CM | POA: Diagnosis not present

## 2017-01-12 HISTORY — DX: Anxiety disorder, unspecified: F41.9

## 2017-01-12 LAB — CBC WITH DIFFERENTIAL/PLATELET
Basophils Absolute: 0.2 10*3/uL — ABNORMAL HIGH (ref 0.0–0.1)
Basophils Relative: 1.3 % (ref 0.0–3.0)
Eosinophils Absolute: 0.3 10*3/uL (ref 0.0–0.7)
Eosinophils Relative: 1.9 % (ref 0.0–5.0)
HEMATOCRIT: 51.8 % — AB (ref 36.0–46.0)
Hemoglobin: 17.1 g/dL — ABNORMAL HIGH (ref 12.0–15.0)
LYMPHS PCT: 30 % (ref 12.0–46.0)
Lymphs Abs: 4.8 10*3/uL — ABNORMAL HIGH (ref 0.7–4.0)
MCHC: 33 g/dL (ref 30.0–36.0)
MCV: 91.9 fl (ref 78.0–100.0)
MONOS PCT: 8.3 % (ref 3.0–12.0)
Monocytes Absolute: 1.3 10*3/uL — ABNORMAL HIGH (ref 0.1–1.0)
NEUTROS ABS: 9.3 10*3/uL — AB (ref 1.4–7.7)
Neutrophils Relative %: 58.5 % (ref 43.0–77.0)
PLATELETS: 310 10*3/uL (ref 150.0–400.0)
RBC: 5.63 Mil/uL — ABNORMAL HIGH (ref 3.87–5.11)
RDW: 13.2 % (ref 11.5–15.5)
WBC: 15.8 10*3/uL — ABNORMAL HIGH (ref 4.0–10.5)

## 2017-01-12 MED ORDER — ESCITALOPRAM OXALATE 10 MG PO TABS: 10.0000 mg | ORAL_TABLET | Freq: Every day | ORAL | 3 refills | Status: DC

## 2017-01-12 MED ORDER — ALPRAZOLAM 0.25 MG PO TABS: 0.2500 mg | ORAL_TABLET | Freq: Two times a day (BID) | ORAL | 0 refills | Status: DC | PRN

## 2017-01-12 NOTE — Assessment & Plan Note (Signed)

## 2017-01-12 NOTE — Progress Notes (Signed)
Subjective:    Patient ID: Jennifer Allison, female    DOB: 01-Nov-1985, 31 y.o.   MRN: [id number]  HPI  Here for wellness and f/u;  Overall doing ok;  Pt denies Chest pain, worsening SOB, DOE, wheezing, orthopnea, PND, worsening LE edema, palpitations, dizziness or syncope.  Pt denies neurological change such as new headache, facial or extremity weakness.  Pt denies polydipsia, polyuria, or low sugar symptoms. Pt states overall good compliance with treatment and medications, good tolerability, and has been trying to follow appropriate diet.  Pt denies worsening depressive symptoms, suicidal ideation or panic. No fever, night sweats, wt loss, loss of appetite, or other constitutional symptoms.  Pt states good ability with ADL's, has low fall risk, home safety reviewed and adequate, no other significant changes in hearing or vision, and not active with regular exercise, and has unfortuantely gained 25 lbs.  Also, has High anxiety recently, had panic attack x 1 mo ago - was anxious, + palps, tearful, more stress work stress recently, having counseling at work. Mother with bipolar, but Denies worsening depressive symptoms, suicidal ideation.  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  Denies fever, but does have daytime somnolence, fatigue and recent wt gain, not sure about snoring at night Past Medical History:  Diagnosis Date  . Anxiety 01/12/2017  . Hyperlipidemia 05/27/2011  . Hypertension   . Migraines   . Morbid obesity ([place])    No past surgical history on file.  reports that  has never smoked. she has never used smokeless tobacco. She reports that she drinks alcohol. She reports that she does not use drugs. family history includes Cancer in her other; Diabetes in her other and other; Hyperlipidemia in her father and other; Hypertension in her father and other; Mental illness in her other; Sudden death in her other. Allergies  Allergen Reactions  .  Penicillins Hives   Current Outpatient Medications on File Prior to Visit  Medication Sig Dispense Refill  . hydrochlorothiazide (HYDRODIURIL) 12.5 MG tablet TAKE 1 TABLET BY MOUTH  DAILY 30 tablet 0  . lisinopril (PRINIVIL,ZESTRIL) 20 MG tablet Take 1 tablet (20 mg total) daily by mouth. 90 tablet 1   No current facility-administered medications on file prior to visit.    Review of Systems Constitutional: Negative for other unusual diaphoresis, sweats, appetite or weight changes HENT: Negative for other worsening hearing loss, ear pain, facial swelling, mouth sores or neck stiffness.   Eyes: Negative for other worsening pain, redness or other visual disturbance.  Respiratory: Negative for other stridor or swelling Cardiovascular: Negative for other palpitations or other chest pain  Gastrointestinal: Negative for worsening diarrhea or loose stools, blood in stool, distention or other pain Genitourinary: Negative for hematuria, flank pain or other change in urine volume.  Musculoskeletal: Negative for myalgias or other joint swelling.  Skin: Negative for other color change, or other wound or worsening drainage.  Neurological: Negative for other syncope or numbness. Hematological: Negative for other adenopathy or swelling Psychiatric/Behavioral: Negative for hallucinations, other worsening agitation, SI, self-injury, or new decreased concentration All other system neg per pt    Objective:   Physical Exam BP 132/86   Pulse 87   Temp 98.3 F (36.8 C) (Oral)   Ht 5\' 7"  (1.702 m)   Wt (!) 310 lb (140.6 kg)   SpO2 99%   BMI 48.55 kg/m  VS noted,  Constitutional: Pt is oriented to person, place, and time. Appears well-developed and well-nourished, in no  significant distress and comfortable Head: Normocephalic and atraumatic  Eyes: Conjunctivae and EOM are normal. Pupils are equal, round, and reactive to light Right Ear: External ear normal without discharge Left Ear: External ear  normal without discharge Nose: Nose without discharge or deformity Mouth/Throat: Oropharynx is without other ulcerations and moist  Neck: Normal range of motion. Neck supple. No JVD present. No tracheal deviation present or significant neck LA or mass Cardiovascular: Normal rate, regular rhythm, normal heart sounds and intact distal pulses.   Pulmonary/Chest: WOB normal and breath sounds without rales or wheezing  Abdominal: Soft. Bowel sounds are normal. NT. No HSM  Musculoskeletal: Normal range of motion. Exhibits no edema Lymphadenopathy: Has no other cervical adenopathy.  Neurological: Pt is alert and oriented to person, place, and time. Pt has normal reflexes. No cranial nerve deficit. Motor grossly intact, Gait intact Skin: Skin is warm and dry. No rash noted or new ulcerations Psychiatric:  Has normal mood and affect. Behavior is normal without agitation No other exam findings Lab Results  Component Value Date   WBC 15.8 (H) 01/12/2017   HGB 17.1 (H) 01/12/2017   HCT 51.8 (H) 01/12/2017   PLT 310.0 01/12/2017   GLUCOSE 72 01/06/2017   CHOL 185 10/18/2013   TRIG 92.0 10/18/2013   HDL 42.40 10/18/2013   LDLCALC 124 (H) 10/18/2013   ALT 32 10/18/2013   AST 27 10/18/2013   NA 138 01/06/2017   K 4.1 01/06/2017   CL 100 01/06/2017   CREATININE 0.85 01/06/2017   BUN 11 01/06/2017   CO2 19 (L) 01/06/2017   TSH 2.100 12/22/2016      Assessment & Plan:

## 2017-01-12 NOTE — Assessment & Plan Note (Signed)
Mild, for r/o osa - refer pulm

## 2017-01-12 NOTE — Assessment & Plan Note (Signed)
With ? OSA related, for lab f/u but consider heme referral as well

## 2017-01-12 NOTE — Assessment & Plan Note (Addendum)
Mild to mod with panic, for xanax prn limited rx, and lexapro asd,  to f/u any worsening symptoms or concerns  In addition to the time spent performing CPE, I spent an additional 15 minutes face to face,in which greater than 50% of this time was spent in counseling and coordination of care for patient's illness as documented, including the differential dx, tx, further evaluation and other management of anxiety disorder, panic, leukocytosis, polycythemia and possible sleep apnea

## 2017-01-12 NOTE — Assessment & Plan Note (Signed)
Afeb, exam benign, for ua with labs, f/u cbc, consider heme referral

## 2017-01-12 NOTE — Patient Instructions (Addendum)
Please take all new medication as prescribed - the xanax and lexapro  Please continue all other medications as before, and refills have been done if requested.  Please have the pharmacy call with any other refills you may need.  Please continue your efforts at being more active, low cholesterol diet, and weight control.  You are otherwise up to date with prevention measures today.  Please keep your appointments with your specialists as you may have planned  You will be contacted regarding the referral for: Pulmonary  Please go to the LAB in the Basement (turn left off the elevator) for the tests to be done today  You will be contacted by phone if any changes need to be made immediately.  Otherwise, you will receive a letter about your results with an explanation, but please check with MyChart first.  Please remember to sign up for MyChart if you have not done so, as this will be important to you in the future with finding out test results, communicating by private email, and scheduling acute appointments online when needed.  Please return in 6 months, or sooner if needed

## 2017-01-13 LAB — HIV ANTIBODY (ROUTINE TESTING W REFLEX): HIV 1&2 Ab, 4th Generation: NONREACTIVE

## 2017-01-27 ENCOUNTER — Encounter: Payer: Self-pay | Admitting: Internal Medicine

## 2017-02-18 ENCOUNTER — Ambulatory Visit: Payer: 59

## 2017-03-03 DIAGNOSIS — I10 Essential (primary) hypertension: Secondary | ICD-10-CM | POA: Insufficient documentation

## 2017-03-30 ENCOUNTER — Encounter: Payer: Self-pay | Admitting: Internal Medicine

## 2017-03-31 ENCOUNTER — Ambulatory Visit: Payer: 59 | Admitting: Cardiovascular Disease

## 2017-04-02 ENCOUNTER — Encounter: Payer: Self-pay | Admitting: Internal Medicine

## 2017-04-03 ENCOUNTER — Other Ambulatory Visit: Payer: Self-pay | Admitting: Cardiovascular Disease

## 2017-04-03 DIAGNOSIS — I119 Hypertensive heart disease without heart failure: Secondary | ICD-10-CM

## 2017-04-05 NOTE — Telephone Encounter (Signed)
Rx request sent to pharmacy.  

## 2017-06-10 ENCOUNTER — Other Ambulatory Visit: Payer: Self-pay | Admitting: Cardiovascular Disease

## 2017-06-10 DIAGNOSIS — I119 Hypertensive heart disease without heart failure: Secondary | ICD-10-CM

## 2017-06-11 NOTE — Telephone Encounter (Signed)
REFILL 

## 2017-06-30 ENCOUNTER — Ambulatory Visit: Payer: 59 | Admitting: Internal Medicine

## 2017-11-08 ENCOUNTER — Other Ambulatory Visit: Payer: Self-pay | Admitting: Cardiovascular Disease

## 2017-11-08 DIAGNOSIS — I119 Hypertensive heart disease without heart failure: Secondary | ICD-10-CM

## 2017-11-08 NOTE — Telephone Encounter (Signed)
Rx sent to pharmacy   

## 2018-05-14 ENCOUNTER — Other Ambulatory Visit: Payer: Self-pay | Admitting: Cardiovascular Disease

## 2018-05-14 DIAGNOSIS — I119 Hypertensive heart disease without heart failure: Secondary | ICD-10-CM

## 2018-06-14 ENCOUNTER — Telehealth: Payer: Self-pay

## 2018-06-14 NOTE — Telephone Encounter (Signed)
Appointment has been made for tomorrow 4/15

## 2018-06-14 NOTE — Telephone Encounter (Signed)
I think ok for office visit given she is low risk

## 2018-06-14 NOTE — Telephone Encounter (Signed)
Copied from [place] [id number]. Topic: Appointment Scheduling - Scheduling Inquiry for Clinic >> Jun 14, 2018  2:35 PM Richardo Priest, [place] wrote: Reason for CRM: Patient called in stating she has a mole on her right arm that gets bigger and would like an appointment to check it out. Patient would also like to schedule a physical. Call back number is  [phone]. >> Jun 14, 2018  3:27 PM Para Skeans A wrote: Patient has not been here since 2018, Is it okay to make an appointment for the mole on virtual visit.

## 2018-06-15 ENCOUNTER — Ambulatory Visit (INDEPENDENT_AMBULATORY_CARE_PROVIDER_SITE_OTHER): Payer: 59 | Admitting: Internal Medicine

## 2018-06-15 ENCOUNTER — Other Ambulatory Visit: Payer: Self-pay

## 2018-06-15 ENCOUNTER — Other Ambulatory Visit: Payer: Self-pay | Admitting: Internal Medicine

## 2018-06-15 ENCOUNTER — Encounter: Payer: Self-pay | Admitting: Internal Medicine

## 2018-06-15 ENCOUNTER — Other Ambulatory Visit (INDEPENDENT_AMBULATORY_CARE_PROVIDER_SITE_OTHER): Payer: 59

## 2018-06-15 VITALS — BP 126/84 | HR 113 | Temp 98.5°F | Ht 67.0 in | Wt 322.0 lb

## 2018-06-15 DIAGNOSIS — D72828 Other elevated white blood cell count: Secondary | ICD-10-CM

## 2018-06-15 DIAGNOSIS — R400 Somnolence: Secondary | ICD-10-CM

## 2018-06-15 DIAGNOSIS — R739 Hyperglycemia, unspecified: Secondary | ICD-10-CM

## 2018-06-15 DIAGNOSIS — I10 Essential (primary) hypertension: Secondary | ICD-10-CM

## 2018-06-15 DIAGNOSIS — D751 Secondary polycythemia: Secondary | ICD-10-CM

## 2018-06-15 DIAGNOSIS — Z0000 Encounter for general adult medical examination without abnormal findings: Secondary | ICD-10-CM | POA: Diagnosis not present

## 2018-06-15 DIAGNOSIS — D72829 Elevated white blood cell count, unspecified: Secondary | ICD-10-CM

## 2018-06-15 DIAGNOSIS — D229 Melanocytic nevi, unspecified: Secondary | ICD-10-CM | POA: Diagnosis not present

## 2018-06-15 LAB — URINALYSIS, ROUTINE W REFLEX MICROSCOPIC
Bilirubin Urine: NEGATIVE
Hgb urine dipstick: NEGATIVE
Ketones, ur: NEGATIVE
Nitrite: NEGATIVE
Specific Gravity, Urine: 1.025 (ref 1.000–1.030)
Urine Glucose: NEGATIVE
Urobilinogen, UA: 0.2 (ref 0.0–1.0)
pH: 5.5 (ref 5.0–8.0)

## 2018-06-15 LAB — CBC WITH DIFFERENTIAL/PLATELET
Basophils Absolute: 0.2 10*3/uL — ABNORMAL HIGH (ref 0.0–0.1)
Basophils Relative: 1.1 % (ref 0.0–3.0)
Eosinophils Absolute: 0.4 10*3/uL (ref 0.0–0.7)
Eosinophils Relative: 2.8 % (ref 0.0–5.0)
HCT: 52.4 % — ABNORMAL HIGH (ref 36.0–46.0)
Hemoglobin: 17.9 g/dL — ABNORMAL HIGH (ref 12.0–15.0)
Lymphocytes Relative: 22.9 % (ref 12.0–46.0)
Lymphs Abs: 3.6 10*3/uL (ref 0.7–4.0)
MCHC: 34.1 g/dL (ref 30.0–36.0)
MCV: 90.8 fl (ref 78.0–100.0)
Monocytes Absolute: 1.2 10*3/uL — ABNORMAL HIGH (ref 0.1–1.0)
Monocytes Relative: 7.8 % (ref 3.0–12.0)
Neutro Abs: 10.4 10*3/uL — ABNORMAL HIGH (ref 1.4–7.7)
Neutrophils Relative %: 65.4 % (ref 43.0–77.0)
Platelets: 284 10*3/uL (ref 150.0–400.0)
RBC: 5.77 Mil/uL — ABNORMAL HIGH (ref 3.87–5.11)
RDW: 13.5 % (ref 11.5–15.5)
WBC: 15.9 10*3/uL — ABNORMAL HIGH (ref 4.0–10.5)

## 2018-06-15 LAB — LIPID PANEL
Cholesterol: 203 mg/dL — ABNORMAL HIGH (ref 0–200)
HDL: 49.6 mg/dL (ref >39.00)
LDL Cholesterol: 130 mg/dL — ABNORMAL HIGH (ref 0–99)
NonHDL: 153.23
Total CHOL/HDL Ratio: 4
Triglycerides: 114 mg/dL (ref 0.0–149.0)
VLDL: 22.8 mg/dL (ref 0.0–40.0)

## 2018-06-15 LAB — HEPATIC FUNCTION PANEL
ALT: 32 U/L (ref 0–35)
AST: 34 U/L (ref 0–37)
Albumin: 4.6 g/dL (ref 3.5–5.2)
Alkaline Phosphatase: 91 U/L (ref 39–117)
Bilirubin, Direct: 0 mg/dL (ref 0.0–0.3)
Total Bilirubin: 0.4 mg/dL (ref 0.2–1.2)
Total Protein: 8.5 g/dL — ABNORMAL HIGH (ref 6.0–8.3)

## 2018-06-15 LAB — HEMOGLOBIN A1C: Hgb A1c MFr Bld: 6.3 % (ref 4.6–6.5)

## 2018-06-15 LAB — BASIC METABOLIC PANEL
BUN: 11 mg/dL (ref 6–23)
CO2: 27 mEq/L (ref 19–32)
Calcium: 10.2 mg/dL (ref 8.4–10.5)
Chloride: 101 mEq/L (ref 96–112)
Creatinine, Ser: 0.9 mg/dL (ref 0.40–1.20)
GFR: 87.06 mL/min (ref >60.00)
Glucose, Bld: 83 mg/dL (ref 70–99)
Potassium: 3.9 mEq/L (ref 3.5–5.1)
Sodium: 137 mEq/L (ref 135–145)

## 2018-06-15 LAB — TSH: TSH: 1.62 u[IU]/mL (ref 0.35–4.50)

## 2018-06-15 NOTE — Assessment & Plan Note (Signed)
Exam is benign, ok to follow, consider derm referral for worsening size or color

## 2018-06-15 NOTE — Progress Notes (Signed)
Subjective:    Patient ID: Jennifer Allison, female    DOB: Apr 28, 1985, 33 y.o.   MRN: [id number]  HPI  Here for wellness and f/u;  Overall doing ok;  Pt denies Chest pain, worsening SOB, DOE, wheezing, orthopnea, PND, worsening LE edema, palpitations, dizziness or syncope.  Pt denies neurological change such as new headache, facial or extremity weakness.  Pt denies polydipsia, polyuria, or low sugar symptoms. Pt states overall good compliance with treatment and medications, good tolerability, and has been trying to follow appropriate diet.  Pt denies worsening depressive symptoms, suicidal ideation or panic. No fever, night sweats, wt loss, loss of appetite, or other constitutional symptoms.  Pt states good ability with ADL's, has low fall risk, home safety reviewed and adequate, no other significant changes in hearing or vision, and only occasionally active with exercise with walking at work occasionally.  Has overall gained significant wt, she attributes to her birth control implant.  Does have a mole to right mid lower medial arm that has enlarged somewhat over the last few yrs.   Does still have occasional daytime somnolence, and 3 yrs of gradually worsening polycythemia with wt gain as well.  Mother and father have OSA.   Does also mention she had recent biometrics with slightly elevated blood sugar Past Medical History:  Diagnosis Date   Anxiety 01/12/2017   Hyperlipidemia 05/27/2011   Hypertension    Migraines    Morbid obesity ([place])    No past surgical history on file.  reports that she has never smoked. She has never used smokeless tobacco. She reports current alcohol use. She reports that she does not use drugs. family history includes Cancer in an other family member; Diabetes in some other family members; Hyperlipidemia in her father and another family member; Hypertension in her father and another family member; Mental illness in an other family member; Sudden death in an other  family member. Allergies  Allergen Reactions   Penicillins Hives   Current Outpatient Medications on File Prior to Visit  Medication Sig Dispense Refill   ALPRAZolam (XANAX) 0.25 MG tablet Take 1 tablet (0.25 mg total) 2 (two) times daily as needed by mouth for anxiety. 40 tablet 0   hydrochlorothiazide (HYDRODIURIL) 12.5 MG tablet TAKE 1 TABLET BY MOUTH  DAILY 30 tablet 5   lisinopril (PRINIVIL,ZESTRIL) 20 MG tablet Take 1 tablet (20 mg total) by mouth daily. SCHEDULE OV FOR FURTHER REFILLS. LAST ATTEMPT. 15 tablet 0   escitalopram (LEXAPRO) 10 MG tablet Take 1 tablet (10 mg total) daily by mouth. 90 tablet 3   No current facility-administered medications on file prior to visit.    Review of Systems Constitutional: Negative for other unusual diaphoresis, sweats, appetite or weight changes HENT: Negative for other worsening hearing loss, ear pain, facial swelling, mouth sores or neck stiffness.   Eyes: Negative for other worsening pain, redness or other visual disturbance.  Respiratory: Negative for other stridor or swelling Cardiovascular: Negative for other palpitations or other chest pain  Gastrointestinal: Negative for worsening diarrhea or loose stools, blood in stool, distention or other pain Genitourinary: Negative for hematuria, flank pain or other change in urine volume.  Musculoskeletal: Negative for myalgias or other joint swelling.  Skin: Negative for other color change, or other wound or worsening drainage.  Neurological: Negative for other syncope or numbness. Hematological: Negative for other adenopathy or swelling Psychiatric/Behavioral: Negative for hallucinations, other worsening agitation, SI, self-injury, or new decreased concentration All other system neg per pt  Objective:   Physical Exam BP 126/84    Pulse (!) 113    Temp 98.5 F (36.9 C) (Oral)    Ht 5\' 7"  (1.702 m)    Wt (!) 322 lb (146.1 kg)    SpO2 95%    BMI 50.43 kg/m  VS noted,  Constitutional:  Pt is oriented to person, place, and time. Appears well-developed and well-nourished, in no significant distress and comfortable Head: Normocephalic and atraumatic  Eyes: Conjunctivae and EOM are normal. Pupils are equal, round, and reactive to light Right Ear: External ear normal without discharge Left Ear: External ear normal without discharge Nose: Nose without discharge or deformity Mouth/Throat: Oropharynx is without other ulcerations and moist  Neck: Normal range of motion. Neck supple. No JVD present. No tracheal deviation present or significant neck LA or mass Cardiovascular: Normal rate, regular rhythm, normal heart sounds and intact distal pulses.   Pulmonary/Chest: WOB normal and breath sounds without rales or wheezing  Abdominal: Soft. Bowel sounds are normal. NT. No HSM  Musculoskeletal: Normal range of motion. Exhibits no edema Lymphadenopathy: Has no other cervical adenopathy.  Neurological: Pt is alert and oriented to person, place, and time. Pt has normal reflexes. No cranial nerve deficit. Motor grossly intact, Gait intact Skin: Skin is warm and dry. No rash noted or new ulcerations; right distal medial mid arm with 8 mm slight raised smooth tan lesion without irregular edges or black or two toned. Psychiatric:  Has normal mood and affect. Behavior is normal without agitation No other exam findings Lab Results  Component Value Date   WBC 15.8 (H) 01/12/2017   HGB 17.1 (H) 01/12/2017   HCT 51.8 (H) 01/12/2017   PLT 310.0 01/12/2017   GLUCOSE 72 01/06/2017   CHOL 185 10/18/2013   TRIG 92.0 10/18/2013   HDL 42.40 10/18/2013   LDLCALC 124 (H) 10/18/2013   ALT 32 10/18/2013   AST 27 10/18/2013   NA 138 01/06/2017   K 4.1 01/06/2017   CL 100 01/06/2017   CREATININE 0.85 01/06/2017   BUN 11 01/06/2017   CO2 19 (L) 01/06/2017   TSH 2.100 12/22/2016       Assessment & Plan:

## 2018-06-15 NOTE — Assessment & Plan Note (Signed)
stable overall by history and exam, recent data reviewed with pt, and pt to continue medical treatment as before,  to f/u any worsening symptoms or concerns  

## 2018-06-15 NOTE — Assessment & Plan Note (Signed)

## 2018-06-15 NOTE — Assessment & Plan Note (Signed)
For a1c with labs

## 2018-06-15 NOTE — Assessment & Plan Note (Signed)
Mild persistent, afeb, to recheck lab, consider heme referral

## 2018-06-15 NOTE — Patient Instructions (Addendum)
Please continue all other medications as before, and refills have been done if requested.  Please have the pharmacy call with any other refills you may need.  Please continue your efforts at being more active, low cholesterol diet, and weight control.  You are otherwise up to date with prevention measures today.  Please keep your appointments with your specialists as you may have planned  Please call for dermatology appt if the mole gets larger  You will be contacted regarding the referral for: Pulmonary  Please go to the LAB in the Basement (turn left off the elevator) for the tests to be done today  You will be contacted by phone if any changes need to be made immediately.  Otherwise, you will receive a letter about your results with an explanation, but please check with MyChart first.  Please remember to sign up for MyChart if you have not done so, as this will be important to you in the future with finding out test results, communicating by private email, and scheduling acute appointments online when needed.  Please return in 1 year for your yearly visit, or sooner if needed, with Lab testing done 3-5 days before

## 2018-06-15 NOTE — Assessment & Plan Note (Signed)
Suspect related to underlying OSA, for recheck, consider hematology referral

## 2018-06-15 NOTE — Assessment & Plan Note (Signed)
For pulm referral, may need sleep study

## 2018-06-16 ENCOUNTER — Telehealth: Payer: Self-pay | Admitting: Internal Medicine

## 2018-06-16 ENCOUNTER — Encounter: Payer: Self-pay | Admitting: Internal Medicine

## 2018-06-16 NOTE — Telephone Encounter (Signed)
Copied from [place] [id number]. Topic: General - Inquiry >> Jun 16, 2018  2:24 PM Rainey Pines A wrote: Reason for CRM: Patient stated that she was referred to oncology but wasn't informed as to why she was referred. Patient would like a callback from Dr. Jenny Reichmann or Dr. Gwynn Burly nurse. Best contact number [phone]

## 2018-06-16 NOTE — Telephone Encounter (Signed)
A new hem appt has been scheduled to see Dr. Walden Field on 5/6 at 1pm. Pt aware to arrive 15 minutes early. Letter mailed.

## 2018-06-16 NOTE — Telephone Encounter (Signed)
Notes recorded by Cresenciano Lick, CMA on 06/16/2018 at 3:19 PM EDT Pt informed of below. ------  Notes recorded by Biagio Borg, MD on 06/15/2018 at 4:14 PM EDT Letter sent, cont same tx except  The test results show that your current treatment is OK, as the tests including the sugar are OK, except the White blood cells and Hemoglobin (red cells) are worse. In addition to what we discussed today, we need to have you see Hematology (the blood doctor) for further consideration, and any treatment that may be needed. I will do this, and you should hear from the office as well.Redmond Baseman to please inform pt, I will do referral

## 2018-06-27 ENCOUNTER — Other Ambulatory Visit (INDEPENDENT_AMBULATORY_CARE_PROVIDER_SITE_OTHER): Payer: 59

## 2018-06-27 ENCOUNTER — Encounter: Payer: Self-pay | Admitting: Internal Medicine

## 2018-06-27 ENCOUNTER — Ambulatory Visit (INDEPENDENT_AMBULATORY_CARE_PROVIDER_SITE_OTHER): Payer: 59 | Admitting: Internal Medicine

## 2018-06-27 DIAGNOSIS — F419 Anxiety disorder, unspecified: Secondary | ICD-10-CM | POA: Diagnosis not present

## 2018-06-27 DIAGNOSIS — R300 Dysuria: Secondary | ICD-10-CM

## 2018-06-27 DIAGNOSIS — R739 Hyperglycemia, unspecified: Secondary | ICD-10-CM

## 2018-06-27 LAB — URINALYSIS, ROUTINE W REFLEX MICROSCOPIC
Bilirubin Urine: NEGATIVE
Hgb urine dipstick: NEGATIVE
Ketones, ur: NEGATIVE
Nitrite: NEGATIVE
RBC / HPF: NONE SEEN (ref 0–?)
Specific Gravity, Urine: 1.025 (ref 1.000–1.030)
Total Protein, Urine: NEGATIVE
Urine Glucose: NEGATIVE
Urobilinogen, UA: 0.2 (ref 0.0–1.0)
pH: 5 (ref 5.0–8.0)

## 2018-06-27 MED ORDER — SULFAMETHOXAZOLE-TRIMETHOPRIM 800-160 MG PO TABS: 1.0000 | ORAL_TABLET | Freq: Two times a day (BID) | ORAL | 0 refills | Status: DC

## 2018-06-27 NOTE — Patient Instructions (Signed)
Please take all new medication as prescribed - the antibiotic  Please continue all other medications as before, and refills have been done if requested.  Please have the pharmacy call with any other refills you may need.  Please continue your efforts at being more active, low cholesterol diet, and weight control.  You are otherwise up to date with prevention measures today.  Please keep your appointments with your specialists as you may have planned  Please go to the LAB in the Basement (turn left off the elevator) for the tests to be done as soon as you are able (just the urine testing)  You will be contacted by phone if any changes need to be made immediately.  Otherwise, you will receive a letter about your results with an explanation, but please check with MyChart first.  Please remember to sign up for MyChart if you have not done so, as this will be important to you in the future with finding out test results, communicating by private email, and scheduling acute appointments online when needed.

## 2018-06-27 NOTE — Assessment & Plan Note (Signed)
stable overall by history and exam, recent data reviewed with pt, and pt to continue medical treatment as before,  to f/u any worsening symptoms or concerns  

## 2018-06-27 NOTE — Assessment & Plan Note (Signed)
C/w likely UTI, for urine studies, empiric antibiotic asd, f/u culture results

## 2018-06-27 NOTE — Progress Notes (Signed)
Patient ID: Jennifer Allison, female   DOB: February 09, 1986, 33 y.o.   MRN: [id number]  Virtual Visit via Video Note  I connected with Jennifer Allison on 06/27/18 at  3:00 PM EDT by a video enabled telemedicine application and verified that I am speaking with the correct person using two identifiers.  Pt is at home, I am at office, no other persons present   I discussed the limitations of evaluation and management by telemedicine and the availability of in person appointments. The patient expressed understanding and agreed to proceed.  History of Present Illness: 33yo F with 3 days onset urinary symptoms such as dysuria, frequency, urgency, left lower back or flank pain, buy no hematuria or n/v, fever, chills. Pt denies chest pain, increased sob or doe, wheezing, orthopnea, PND, increased LE swelling, palpitations, dizziness or syncope.  Pt denies new neurological symptoms such as new headache, or facial or extremity weakness or numbness   Pt denies polydipsia, polyuria.    Denies worsening depressive symptoms, suicidal ideation, or panic Past Medical History:  Diagnosis Date  . Anxiety 01/12/2017  . Hyperlipidemia 05/27/2011  . Hypertension   . Migraines   . Morbid obesity ([place])    No past surgical history on file.  reports that she has never smoked. She has never used smokeless tobacco. She reports current alcohol use. She reports that she does not use drugs. family history includes Cancer in an other family member; Diabetes in some other family members; Hyperlipidemia in her father and another family member; Hypertension in her father and another family member; Mental illness in an other family member; Sudden death in an other family member. Allergies  Allergen Reactions  . Penicillins Hives   Current Outpatient Medications on File Prior to Visit  Medication Sig Dispense Refill  . ALPRAZolam (XANAX) 0.25 MG tablet Take 1 tablet (0.25 mg total) 2 (two) times daily as needed by mouth for anxiety.  40 tablet 0  . hydrochlorothiazide (HYDRODIURIL) 12.5 MG tablet TAKE 1 TABLET BY MOUTH  DAILY 30 tablet 5  . lisinopril (PRINIVIL,ZESTRIL) 20 MG tablet Take 1 tablet (20 mg total) by mouth daily. SCHEDULE OV FOR FURTHER REFILLS. LAST ATTEMPT. 15 tablet 0  . escitalopram (LEXAPRO) 10 MG tablet Take 1 tablet (10 mg total) daily by mouth. 90 tablet 3   No current facility-administered medications on file prior to visit.     Observations/Objective: Alert, NAD, mild ill appearing, cn 2-12 intact, moves all 4s, no visible rash or sweling, mild nervous Lab Results  Component Value Date   WBC 15.9 (H) 06/15/2018   HGB 17.9 (H) 06/15/2018   HCT 52.4 (H) 06/15/2018   PLT 284.0 06/15/2018   GLUCOSE 83 06/15/2018   CHOL 203 (H) 06/15/2018   TRIG 114.0 06/15/2018   HDL 49.60 06/15/2018   LDLCALC 130 (H) 06/15/2018   ALT 32 06/15/2018   AST 34 06/15/2018   NA 137 06/15/2018   K 3.9 06/15/2018   CL 101 06/15/2018   CREATININE 0.90 06/15/2018   BUN 11 06/15/2018   CO2 27 06/15/2018   TSH 1.62 06/15/2018   HGBA1C 6.3 06/15/2018   Assessment and Plan: See notes  Follow Up Instructions: See notes   I discussed the assessment and treatment plan with the patient. The patient was provided an opportunity to ask questions and all were answered. The patient agreed with the plan and demonstrated an understanding of the instructions.   The patient was advised to call back or seek an in-person evaluation  if the symptoms worsen or if the condition fails to improve as anticipated.Cathlean Cower, MD

## 2018-06-28 LAB — URINE CULTURE
MICRO NUMBER:: 423920
SPECIMEN QUALITY:: ADEQUATE

## 2018-07-06 ENCOUNTER — Inpatient Hospital Stay: Payer: 59 | Attending: Internal Medicine | Admitting: Internal Medicine

## 2018-07-06 ENCOUNTER — Inpatient Hospital Stay: Payer: 59

## 2018-07-06 ENCOUNTER — Other Ambulatory Visit: Payer: Self-pay

## 2018-07-06 ENCOUNTER — Encounter: Payer: Self-pay | Admitting: Internal Medicine

## 2018-07-06 VITALS — BP 141/109 | HR 116 | Resp 18 | Ht 67.0 in | Wt 326.7 lb

## 2018-07-06 DIAGNOSIS — F419 Anxiety disorder, unspecified: Secondary | ICD-10-CM | POA: Diagnosis not present

## 2018-07-06 DIAGNOSIS — R300 Dysuria: Secondary | ICD-10-CM

## 2018-07-06 DIAGNOSIS — F17210 Nicotine dependence, cigarettes, uncomplicated: Secondary | ICD-10-CM | POA: Diagnosis not present

## 2018-07-06 DIAGNOSIS — Z79899 Other long term (current) drug therapy: Secondary | ICD-10-CM | POA: Diagnosis not present

## 2018-07-06 DIAGNOSIS — D72828 Other elevated white blood cell count: Secondary | ICD-10-CM

## 2018-07-06 DIAGNOSIS — I10 Essential (primary) hypertension: Secondary | ICD-10-CM | POA: Insufficient documentation

## 2018-07-06 DIAGNOSIS — M2550 Pain in unspecified joint: Secondary | ICD-10-CM | POA: Diagnosis not present

## 2018-07-06 DIAGNOSIS — D72829 Elevated white blood cell count, unspecified: Secondary | ICD-10-CM | POA: Diagnosis not present

## 2018-07-06 DIAGNOSIS — D751 Secondary polycythemia: Secondary | ICD-10-CM

## 2018-07-06 LAB — CBC WITH DIFFERENTIAL (CANCER CENTER ONLY)
Abs Immature Granulocytes: 0.06 10*3/uL (ref 0.00–0.07)
Basophils Absolute: 0.1 10*3/uL (ref 0.0–0.1)
Basophils Relative: 1 %
Eosinophils Absolute: 0.3 10*3/uL (ref 0.0–0.5)
Eosinophils Relative: 2 %
HCT: 51.5 % — ABNORMAL HIGH (ref 36.0–46.0)
Hemoglobin: 17.1 g/dL — ABNORMAL HIGH (ref 12.0–15.0)
Immature Granulocytes: 0 %
Lymphocytes Relative: 24 %
Lymphs Abs: 3.3 10*3/uL (ref 0.7–4.0)
MCH: 30.6 pg (ref 26.0–34.0)
MCHC: 33.2 g/dL (ref 30.0–36.0)
MCV: 92.1 fL (ref 80.0–100.0)
Monocytes Absolute: 1.1 10*3/uL — ABNORMAL HIGH (ref 0.1–1.0)
Monocytes Relative: 8 %
Neutro Abs: 8.9 10*3/uL — ABNORMAL HIGH (ref 1.7–7.7)
Neutrophils Relative %: 65 %
Platelet Count: 266 10*3/uL (ref 150–400)
RBC: 5.59 MIL/uL — ABNORMAL HIGH (ref 3.87–5.11)
RDW: 13.1 % (ref 11.5–15.5)
WBC Count: 13.7 10*3/uL — ABNORMAL HIGH (ref 4.0–10.5)
nRBC: 0 % (ref 0.0–0.2)

## 2018-07-06 LAB — CMP (CANCER CENTER ONLY)
ALT: 41 U/L (ref 0–44)
AST: 54 U/L — ABNORMAL HIGH (ref 15–41)
Albumin: 4.1 g/dL (ref 3.5–5.0)
Alkaline Phosphatase: 92 U/L (ref 38–126)
Anion gap: 13 (ref 5–15)
BUN: 14 mg/dL (ref 6–20)
CO2: 23 mmol/L (ref 22–32)
Calcium: 10 mg/dL (ref 8.9–10.3)
Chloride: 106 mmol/L (ref 98–111)
Creatinine: 1.03 mg/dL — ABNORMAL HIGH (ref 0.44–1.00)
GFR, Est AFR Am: >60 mL/min (ref >60)
GFR, Estimated: >60 mL/min (ref >60)
Glucose, Bld: 97 mg/dL (ref 70–99)
Potassium: 3.9 mmol/L (ref 3.5–5.1)
Sodium: 142 mmol/L (ref 135–145)
Total Bilirubin: 0.3 mg/dL (ref 0.3–1.2)
Total Protein: 8.4 g/dL — ABNORMAL HIGH (ref 6.5–8.1)

## 2018-07-06 LAB — URINALYSIS, COMPLETE (UACMP) WITH MICROSCOPIC
Bilirubin Urine: NEGATIVE
Glucose, UA: NEGATIVE mg/dL
Hgb urine dipstick: NEGATIVE
Ketones, ur: NEGATIVE mg/dL
Nitrite: NEGATIVE
Protein, ur: 30 mg/dL — AB
Specific Gravity, Urine: 1.031 — ABNORMAL HIGH (ref 1.005–1.030)
WBC, UA: >50 WBC/hpf — ABNORMAL HIGH (ref 0–5)
pH: 5 (ref 5.0–8.0)

## 2018-07-06 LAB — IRON AND TIBC
Iron: 83 ug/dL (ref 41–142)
Saturation Ratios: 19 % — ABNORMAL LOW (ref 21–57)
TIBC: 448 ug/dL — ABNORMAL HIGH (ref 236–444)
UIBC: 365 ug/dL (ref 120–384)

## 2018-07-06 LAB — FERRITIN: Ferritin: 68 ng/mL (ref 11–307)

## 2018-07-06 LAB — SEDIMENTATION RATE: Sed Rate: 4 mm/hr (ref 0–22)

## 2018-07-06 LAB — LACTATE DEHYDROGENASE: LDH: 264 U/L — ABNORMAL HIGH (ref 98–192)

## 2018-07-06 NOTE — Progress Notes (Signed)
Referring Physician:  Biagio Borg, MD  Diagnosis Other elevated white blood cell (WBC) count - Plan: CBC with Differential ([place] Only), CMP ([place] only), Lactate dehydrogenase (LDH), Sedimentation rate, SPEP with reflex to IFE, BCR ABL1 FISH ([facility]), Culture, Urine, Urinalysis, Complete w Microscopic, Ferritin, Iron and TIBC  Polycythemia, secondary - Plan: CBC with Differential ([place] Only), CMP ([place] only), Lactate dehydrogenase (LDH), Sedimentation rate, SPEP with reflex to IFE, BCR ABL1 FISH ([facility]), Culture, Urine, Urinalysis, Complete w Microscopic, Ferritin, Iron and TIBC  Dysuria - Plan: CBC with Differential ([place] Only), CMP ([place] only), Lactate dehydrogenase (LDH), Sedimentation rate, SPEP with reflex to IFE, BCR ABL1 FISH ([facility]), Culture, Urine, Urinalysis, Complete w Microscopic, Ferritin, Iron and TIBC  Staging Cancer Staging No matching staging information was found for the patient.  Assessment and Plan:  1.  Leucocytosis.  33 year old female referred for evaluation due to persistent leukocytosis.  Pt reports she was last treated with steroids in 03/2018.  She has also been diagnosed with UTIs in the past.  Labs done 01/12/2017 showed WBC 15.8 HB 17.1 plts 310,000.  Labs done 06/15/2018 reviewed and showed WBC 15.9 HB 17.9 plts 284,000.  He has normal differential.  Chemistries WNL with K+ 3.9 Cr 0.90 and normal LFTs.  Pt denies fevers, chills, night sweats and has noted no adenopathy.  She reports some mild dysuria.  She also reports back pain.  Pt is seen today for consultation due to persistent leukocytosis.    Labs done today 07/06/2018 reviewed and showed WBC 13.7 HB 17.1 plts 266,000.  She has a normal differential.  Chemistries WNL with K+ 3.9 Cr 1.03.  AST mildly elevated at 54.  Due to persistent elevation of WBC dating back to 12/2016, will check BCR/ABL.   If negative, suspect reactive process related to smoking and UTI  history.  Pt will have phone visit follow-up in 2 weeks to go over results.    2.  Joint pain.  Pt reports she took goody powder today.  Will check SPEP and Sed rate.  Pt will have phone visit follow-up in 2 weeks.    3.  Dysuria.  Pt reports she has been diagnosed with UTI in the past.  Will check UA and culture today and notify pt of results once available.    4.  Smoking.  Pt reports sporadic smoking.  Cessation recommended.   5.  Hypertension.  BP elevated today at 141/109.  Pt reports she has not taken BP medications.  Pt advised to follow-up with PCP for ongoing monitoring.    6.  Anxiety.  Pt on Xanax.  Follow-up with PCP for management.    40 minutes spent with more than 50% spent in review of records, counseling and coordination of care.    HPI:  33 year old female referred for evaluation due to persistent leukocytosis.  Pt reports she was last treated with steroids in 03/2018.  She has also been diagnosed with UTIs in the past.  Labs done 01/12/2017 showed WBC 15.8 HB 17.1 plts 310,000.  Labs done 06/15/2018 reviewed and showed WBC 15.9 HB 17.9 plts 284,000.  He has normal differential.  Chemistries WNl with K+ 3.9 Cr 0.90 and normal LFTs.  Pt denies fevers, chills, night sweats and has noted no adenopathy.  She reports some mild burning with urination.  She also reports back pain.  Pt is seen today for consultation due to persistent leukocytosis.  Problem List Patient Active Problem List   Diagnosis Date Noted  . Dysuria [R30.0] 06/27/2018  . Benign skin mole [D22.9] 06/15/2018  . Hyperglycemia [R73.9] 06/15/2018  . Hypertension [I10]   . Anxiety [F41.9] 01/12/2017  . Polycythemia [D75.1] 01/12/2017  . Leukocytosis [D72.829] 01/12/2017  . Daytime somnolence [R40.0] 01/12/2017  . Palpitations [R00.2] 11/18/2011  . Dyspnea [R06.00] 11/18/2011  . Right leg pain [M79.604] 11/18/2011  . Hyperlipidemia [E78.5] 05/27/2011  . Wellness examination [G62.69] 05/23/2011    Past  Medical History Past Medical History:  Diagnosis Date  . Anxiety 01/12/2017  . Hyperlipidemia 05/27/2011  . Hypertension   . Migraines   . Morbid obesity ([place])     Past Surgical History No past surgical history on file.  Family History Family History  Problem Relation Age of Onset  . Hypertension Father   . Hyperlipidemia Father   . Hypertension Other   . Mental illness Other   . Hyperlipidemia Other   . Diabetes Other   . Cancer Other        prostate  . Sudden death Other   . Diabetes Other    Breast Cancer on maternal side  Social History  reports that she has never smoked. She has never used smokeless tobacco. She reports current alcohol use. She reports that she does not use drugs.  Pt reports occasional sporadic smoking.    Medications  Current Outpatient Medications:  .  ALPRAZolam (XANAX) 0.25 MG tablet, Take 1 tablet (0.25 mg total) 2 (two) times daily as needed by mouth for anxiety., Disp: 40 tablet, Rfl: 0 .  hydrochlorothiazide (HYDRODIURIL) 12.5 MG tablet, TAKE 1 TABLET BY MOUTH  DAILY, Disp: 30 tablet, Rfl: 5 .  lisinopril (PRINIVIL,ZESTRIL) 20 MG tablet, Take 1 tablet (20 mg total) by mouth daily. SCHEDULE OV FOR FURTHER REFILLS. LAST ATTEMPT., Disp: 15 tablet, Rfl: 0 .  sulfamethoxazole-trimethoprim (BACTRIM DS) 800-160 MG tablet, Take 1 tablet by mouth 2 (two) times daily., Disp: 20 tablet, Rfl: 0  Allergies Penicillins  Review of Systems Review of Systems - Oncology ROS negative other than back pain and dysuria.   Physical Exam  Vitals Wt Readings from Last 3 Encounters:  07/06/18 (!) 326 lb 11.2 oz (148.2 kg)  06/15/18 (!) 322 lb (146.1 kg)  01/12/17 (!) 310 lb (140.6 kg)   Temp Readings from Last 3 Encounters:  06/15/18 98.5 F (36.9 C) (Oral)  01/12/17 98.3 F (36.8 C) (Oral)  04/11/15 97.7 F (36.5 C) (Oral)   BP Readings from Last 3 Encounters:  07/06/18 (!) 141/109  06/15/18 126/84  01/12/17 132/86   Pulse Readings from Last 3  Encounters:  07/06/18 (!) 116  06/15/18 (!) 113  01/12/17 87   Constitutional: Well-developed, well-nourished, and in no distress.   HENT: Head: Normocephalic and atraumatic.  Mouth/Throat: No oropharyngeal exudate. Mucosa moist. Eyes: Pupils are equal, round, and reactive to light. Conjunctivae are normal. No scleral icterus.  Neck: Normal range of motion. Neck supple. No JVD present.  Cardiovascular: Normal rate, regular rhythm and normal heart sounds.  Exam reveals no gallop and no friction rub.   No murmur heard. Pulmonary/Chest: Effort normal and breath sounds normal. No respiratory distress. No wheezes.No rales.  Abdominal: Soft. Bowel sounds are normal. No distension. There is no tenderness. There is no guarding.  Musculoskeletal: No edema or tenderness.  Lymphadenopathy: No cervical,axillary or supraclavicular adenopathy.  Neurological: Alert and oriented to person, place, and time. No cranial nerve deficit.  Skin: Skin is warm and  dry. No rash noted. No erythema. No pallor.  Psychiatric: Affect and judgment normal.   Labs Appointment on 07/06/2018  Component Date Value Ref Range Status  . WBC Count 07/06/2018 13.7* 4.0 - 10.5 K/uL Final  . RBC 07/06/2018 5.59* 3.87 - 5.11 MIL/uL Final  . Hemoglobin 07/06/2018 17.1* 12.0 - 15.0 g/dL Final  . HCT 07/06/2018 51.5* 36.0 - 46.0 % Final  . MCV 07/06/2018 92.1  80.0 - 100.0 fL Final  . MCH 07/06/2018 30.6  26.0 - 34.0 pg Final  . MCHC 07/06/2018 33.2  30.0 - 36.0 g/dL Final  . RDW 07/06/2018 13.1  11.5 - 15.5 % Final  . Platelet Count 07/06/2018 266  150 - 400 K/uL Final  . nRBC 07/06/2018 0.0  0.0 - 0.2 % Final  . Neutrophils Relative % 07/06/2018 65  % Final  . Neutro Abs 07/06/2018 8.9* 1.7 - 7.7 K/uL Final  . Lymphocytes Relative 07/06/2018 24  % Final  . Lymphs Abs 07/06/2018 3.3  0.7 - 4.0 K/uL Final  . Monocytes Relative 07/06/2018 8  % Final  . Monocytes Absolute 07/06/2018 1.1* 0.1 - 1.0 K/uL Final  . Eosinophils  Relative 07/06/2018 2  % Final  . Eosinophils Absolute 07/06/2018 0.3  0.0 - 0.5 K/uL Final  . Basophils Relative 07/06/2018 1  % Final  . Basophils Absolute 07/06/2018 0.1  0.0 - 0.1 K/uL Final  . Immature Granulocytes 07/06/2018 0  % Final  . Abs Immature Granulocytes 07/06/2018 0.06  0.00 - 0.07 K/uL Final   Performed at [facility], [place] [address], [place], [place] [address]     Pathology Orders Placed This Encounter  Procedures  . Culture, Urine    Standing Status:   Future    Number of Occurrences:   1    Standing Expiration Date:   07/06/2019  . CBC with Differential ([facility] Only)    Standing Status:   Future    Number of Occurrences:   1    Standing Expiration Date:   07/06/2019  . CMP ([place] only)    Standing Status:   Future    Number of Occurrences:   1    Standing Expiration Date:   07/06/2019  . Lactate dehydrogenase (LDH)    Standing Status:   Future    Number of Occurrences:   1    Standing Expiration Date:   07/06/2019  . Sedimentation rate    Standing Status:   Future    Number of Occurrences:   1    Standing Expiration Date:   07/06/2019  . SPEP with reflex to IFE    Standing Status:   Future    Number of Occurrences:   1    Standing Expiration Date:   07/06/2019  . BCR ABL1 FISH ([facility])    Standing Status:   Future    Number of Occurrences:   1    Standing Expiration Date:   07/06/2019  . Urinalysis, Complete w Microscopic    Standing Status:   Future    Number of Occurrences:   1    Standing Expiration Date:   07/06/2019  . Ferritin    Standing Status:   Future    Number of Occurrences:   1    Standing Expiration Date:   07/06/2019  . Iron and TIBC    Standing Status:   Future    Number of Occurrences:   1    Standing Expiration Date:  07/06/2019       Zoila Shutter MD

## 2018-07-07 ENCOUNTER — Telehealth: Payer: Self-pay | Admitting: Internal Medicine

## 2018-07-07 LAB — PROTEIN ELECTROPHORESIS, SERUM, WITH REFLEX
A/G Ratio: 1.1 (ref 0.7–1.7)
Albumin ELP: 4 g/dL (ref 2.9–4.4)
Alpha-1-Globulin: 0.2 g/dL (ref 0.0–0.4)
Alpha-2-Globulin: 0.7 g/dL (ref 0.4–1.0)
Beta Globulin: 1.6 g/dL — ABNORMAL HIGH (ref 0.7–1.3)
Gamma Globulin: 1.1 g/dL (ref 0.4–1.8)
Globulin, Total: 3.6 g/dL (ref 2.2–3.9)
Total Protein ELP: 7.6 g/dL (ref 6.0–8.5)

## 2018-07-07 LAB — URINE CULTURE: Culture: NO GROWTH

## 2018-07-07 NOTE — Telephone Encounter (Signed)
Tried to reach regarding schedule °

## 2018-07-08 ENCOUNTER — Telehealth: Payer: Self-pay | Admitting: *Deleted

## 2018-07-08 NOTE — Telephone Encounter (Signed)
Spoke with pt and informed pt of urine culture result - No growth for 2 days - as per Dr. Walden Field.  Pt stated she was feeling better today than 2 days ago.  Stated she completed Bactrim  ABX  Yesterday - prescribed by PCP Dr. Marshall Cork. Instructed pt to increase po fluids and cranberry juice as tolerated.  Pt understood if she experiences UTI symptoms again, she needs to contact PCP.  Pt stated she might also contact her GYN provider for further recommendations.  Pt aware of phone visit on 07/20/18.

## 2018-07-20 ENCOUNTER — Inpatient Hospital Stay (HOSPITAL_BASED_OUTPATIENT_CLINIC_OR_DEPARTMENT_OTHER): Payer: 59 | Admitting: Internal Medicine

## 2018-07-20 DIAGNOSIS — R300 Dysuria: Secondary | ICD-10-CM | POA: Diagnosis not present

## 2018-07-20 DIAGNOSIS — I10 Essential (primary) hypertension: Secondary | ICD-10-CM

## 2018-07-20 DIAGNOSIS — Z79899 Other long term (current) drug therapy: Secondary | ICD-10-CM

## 2018-07-20 DIAGNOSIS — D72828 Other elevated white blood cell count: Secondary | ICD-10-CM | POA: Diagnosis not present

## 2018-07-20 DIAGNOSIS — F17210 Nicotine dependence, cigarettes, uncomplicated: Secondary | ICD-10-CM

## 2018-07-20 DIAGNOSIS — F419 Anxiety disorder, unspecified: Secondary | ICD-10-CM

## 2018-07-20 LAB — BCR ABL1 FISH (GENPATH)

## 2018-07-20 NOTE — Progress Notes (Signed)
Virtual Visit via Telephone Note  I connected with Jennifer Allison on 07/20/18 at  2:40 PM EDT by telephone and verified that I am speaking with the correct person using two identifiers.   I discussed the limitations, risks, security and privacy concerns of performing an evaluation and management service by telephone and the availability of in person appointments. I also discussed with the patient that there may be a patient responsible charge related to this service. The patient expressed understanding and agreed to proceed.  Interval history:  Historical data obtained from note dated 07/06/2018.  33 year old female referred for evaluation due to persistent leukocytosis.  Pt reports she was last treated with steroids in 03/2018.  She has also been diagnosed with UTIs in the past.  Labs done 01/12/2017 showed WBC 15.8 HB 17.1 plts 310,000.  Labs done 06/15/2018 reviewed and showed WBC 15.9 HB 17.9 plts 284,000.  He has normal differential.  Chemistries WNl with K+ 3.9 Cr 0.90 and normal LFTs.  Pt denies fevers, chills, night sweats and has noted no adenopathy.  She reports some mild burning with urination.  She also reports back pain.      Observations/Objective: Review labs from 07/06/2018.    Assessment and Plan:1.  Leucocytosis.  33 year old female referred for evaluation due to persistent leukocytosis.  Pt reports she was last treated with steroids in 03/2018.  She has also been diagnosed with UTIs in the past.  Labs done 01/12/2017 showed WBC 15.8 HB 17.1 plts 310,000.  Labs done 06/15/2018 reviewed and showed WBC 15.9 HB 17.9 plts 284,000.  He has normal differential.  Chemistries WNL with K+ 3.9 Cr 0.90 and normal LFTs.  Pt denies fevers, chills, night sweats and has noted no adenopathy.  She reports some mild dysuria.  She also reports back pain.   Labs done  07/06/2018 reviewed and showed WBC 13.7 HB 17.1 plts 266,000.  She has a normal differential.  Chemistries WNL with K+ 3.9 Cr 1.03.  AST mildly  elevated at 54.  Due to persistent elevation of WBC dating back to 12/2016, checked BCR/ABL which was negative. WBC is improved from 16 on labs done in 06/2018.  I suspect reactive process related to smoking and UTI history.  Pt will RTC in 12/2018 with repeat labs.    2.  Joint pain.  Pt reports she takes goody powders.  She has normal SPEP and Sed rate.  Follow-up with PCP if ongoing symptoms.    3.  Dysuria.  Pt reports she has been diagnosed with UTI in the past.   UA showed bacteria and LE but culture was negative.  Pt should follow-up with PCP or GYN if ongoing symptoms.    4.  Smoking.  Pt reports sporadic smoking.  Cessation recommended.   5.  Hypertension.  Follow-up with PCP for ongoing monitoring.    6.  Anxiety.  Pt on Xanax.  Follow-up with PCP for management.      Follow Up Instructions: RTC in 12/2018 with repeat labs.      I discussed the assessment and treatment plan with the patient. The patient was provided an opportunity to ask questions and all were answered. The patient agreed with the plan and demonstrated an understanding of the instructions.   The patient was advised to call back or seek an in-person evaluation if the symptoms worsen or if the condition fails to improve as anticipated.  I provided 15 minutes of non-face-to-face time during this encounter.  Zoila Shutter, MD

## 2018-08-04 ENCOUNTER — Encounter: Payer: Self-pay | Admitting: Internal Medicine

## 2018-10-20 ENCOUNTER — Other Ambulatory Visit: Payer: Self-pay | Admitting: Internal Medicine

## 2018-10-20 MED ORDER — ALPRAZOLAM 0.25 MG PO TABS: 0.2500 mg | ORAL_TABLET | Freq: Two times a day (BID) | ORAL | 0 refills | Status: DC | PRN

## 2018-10-20 NOTE — Telephone Encounter (Signed)
Control database checked last refill: 01/13/2017 40 tabs LOV: 06/27/2018 [id number]

## 2018-10-20 NOTE — Telephone Encounter (Signed)
Routing to CMA 

## 2018-10-20 NOTE — Telephone Encounter (Signed)
Done erx 

## 2018-10-20 NOTE — Telephone Encounter (Signed)
Patient would like a refill on her ALPRAZolam (XANAX) 0.25 MG tablet medication and have it sent to her preferred pharmacy [facility] on [facility] [facility].

## 2018-12-23 ENCOUNTER — Telehealth: Payer: Self-pay | Admitting: Hematology

## 2018-12-23 NOTE — Telephone Encounter (Signed)
[facility] transfer to [facility]. Left message re calling me to set up follow up.

## 2019-01-13 ENCOUNTER — Telehealth: Payer: Self-pay | Admitting: Hematology and Oncology

## 2019-01-13 NOTE — Telephone Encounter (Signed)
[facility] transfer to [place] [facility]. Left message re patient calling office to set up appointment (*2).

## 2019-01-25 NOTE — Telephone Encounter (Signed)
[facility] transfer to [place]. Left message re patient calling office to set up lab/fu.

## 2019-03-04 ENCOUNTER — Other Ambulatory Visit: Payer: Self-pay | Admitting: Cardiovascular Disease

## 2019-03-04 DIAGNOSIS — I119 Hypertensive heart disease without heart failure: Secondary | ICD-10-CM

## 2019-09-11 ENCOUNTER — Other Ambulatory Visit: Payer: 59

## 2019-09-11 ENCOUNTER — Encounter: Payer: Self-pay | Admitting: Internal Medicine

## 2019-09-11 ENCOUNTER — Other Ambulatory Visit: Payer: Self-pay

## 2019-09-11 ENCOUNTER — Ambulatory Visit (INDEPENDENT_AMBULATORY_CARE_PROVIDER_SITE_OTHER): Payer: 59 | Admitting: Internal Medicine

## 2019-09-11 VITALS — BP 180/120 | HR 99 | Temp 99.2°F | Ht 67.0 in | Wt 333.0 lb

## 2019-09-11 DIAGNOSIS — I10 Essential (primary) hypertension: Secondary | ICD-10-CM

## 2019-09-11 DIAGNOSIS — Z0000 Encounter for general adult medical examination without abnormal findings: Secondary | ICD-10-CM | POA: Diagnosis not present

## 2019-09-11 DIAGNOSIS — R310 Gross hematuria: Secondary | ICD-10-CM

## 2019-09-11 DIAGNOSIS — N912 Amenorrhea, unspecified: Secondary | ICD-10-CM | POA: Diagnosis not present

## 2019-09-11 DIAGNOSIS — R739 Hyperglycemia, unspecified: Secondary | ICD-10-CM | POA: Diagnosis not present

## 2019-09-11 DIAGNOSIS — L989 Disorder of the skin and subcutaneous tissue, unspecified: Secondary | ICD-10-CM | POA: Diagnosis not present

## 2019-09-11 DIAGNOSIS — Z0001 Encounter for general adult medical examination with abnormal findings: Secondary | ICD-10-CM

## 2019-09-11 MED ORDER — LISINOPRIL-HYDROCHLOROTHIAZIDE 20-12.5 MG PO TABS: 1.0000 | ORAL_TABLET | Freq: Every day | ORAL | 3 refills | Status: DC

## 2019-09-11 NOTE — Progress Notes (Signed)
Subjective:    Patient ID: Jennifer Allison, female    DOB: Jul 22, 1985, 34 y.o.   MRN: [id number]  HPI  Here for wellness and f/u;  Overall doing ok;  Pt denies Chest pain, worsening SOB, DOE, wheezing, orthopnea, PND, worsening LE edema, palpitations, dizziness or syncope.  Pt denies neurological change such as new headache, facial or extremity weakness.  Pt denies polydipsia, polyuria, or low sugar symptoms. Pt states overall good compliance with treatment and medications, good tolerability, and has been trying to follow appropriate diet.  Pt denies worsening depressive symptoms, suicidal ideation or panic. No fever, night sweats, wt loss, loss of appetite, or other constitutional symptoms.  Pt states good ability with ADL's, has low fall risk, home safety reviewed and adequate, no other significant changes in hearing or vision, and only occasionally active with exercise. Ran out of BP meds for some time > 1 yr, somewhat due to covid and fear of medical offices. BP Readings from Last 3 Encounters:  09/11/19 (!) 180/120  07/06/18 (!) 141/109  06/15/18 126/84  Denies urinary symptoms such as dysuria, frequency, urgency, flank pain, or n/v, fever, chills but did have an episode of small gross hematuria post intercourse last weekend and wondering if might be UTI  Also she and family are concerned about a raised dark mole to the right forearm enlarging in the past yr, hoping to make sure if not malignant Past Medical History:  Diagnosis Date  . Anxiety 01/12/2017  . Hyperlipidemia 05/27/2011  . Hypertension   . Migraines   . Morbid obesity ([place])    History reviewed. No pertinent surgical history.  reports that she has been smoking. She has never used smokeless tobacco. She reports current alcohol use. She reports that she does not use drugs. family history includes Cancer in an other family member; Diabetes in some other family members; Hyperlipidemia in her father and another family member;  Hypertension in her father and another family member; Mental illness in an other family member; Sudden death in an other family member. Allergies  Allergen Reactions  . Penicillins Hives   Current Outpatient Medications on File Prior to Visit  Medication Sig Dispense Refill  . ALPRAZolam (XANAX) 0.25 MG tablet Take 1 tablet (0.25 mg total) by mouth 2 (two) times daily as needed for anxiety. 40 tablet 0  . lisinopril (PRINIVIL,ZESTRIL) 20 MG tablet Take 1 tablet (20 mg total) by mouth daily. SCHEDULE OV FOR FURTHER REFILLS. LAST ATTEMPT. 15 tablet 0  . sulfamethoxazole-trimethoprim (BACTRIM DS) 800-160 MG tablet Take 1 tablet by mouth 2 (two) times daily. 20 tablet 0   No current facility-administered medications on file prior to visit.   Review of Systems All otherwise neg per pt     Objective:   Physical Exam BP (!) 180/120 (BP Location: Left Arm, Patient Position: Sitting, Cuff Size: Large)   Pulse 99   Temp 99.2 F (37.3 C) (Oral)   Ht 5\' 7"  (1.702 m)   Wt (!) 333 lb (151 kg)   LMP 08/31/2019 (Exact Date)   SpO2 97%   BMI 52.16 kg/m ' VS noted,  Constitutional: Pt appears in NAD HENT: Head: NCAT.  Right Ear: External ear normal.  Left Ear: External ear normal.  Eyes: . Pupils are equal, round, and reactive to light. Conjunctivae and EOM are normal Nose: without d/c or deformity Neck: Neck supple. Gross normal ROM Cardiovascular: Normal rate and regular rhythm.   Pulmonary/Chest: Effort normal and breath sounds without rales or  wheezing.  Abd:  Soft, NT, ND, + BS, no organomegaly Neurological: Pt is alert. At baseline orientation, motor grossly intact Skin: Skin is warm. , no LE edema, right mid forarm with raised dark smooth edges non tender lesion abou 12 x 10 mm Psychiatric: Pt behavior is normal without agitation  All otherwise neg per pt Lab Results  Component Value Date   WBC 13.7 (H) 07/06/2018   HGB 17.1 (H) 07/06/2018   HCT 51.5 (H) 07/06/2018   PLT 266  07/06/2018   GLUCOSE 97 07/06/2018   CHOL 203 (H) 06/15/2018   TRIG 114.0 06/15/2018   HDL 49.60 06/15/2018   LDLCALC 130 (H) 06/15/2018   ALT 41 07/06/2018   AST 54 (H) 07/06/2018   NA 142 07/06/2018   K 3.9 07/06/2018   CL 106 07/06/2018   CREATININE 1.03 (H) 07/06/2018   BUN 14 07/06/2018   CO2 23 07/06/2018   TSH 1.62 06/15/2018   HGBA1C 6.3 06/15/2018       Assessment & Plan:

## 2019-09-11 NOTE — Assessment & Plan Note (Addendum)
Etiology unclear, did not occur with menses but post intercourse, no evidence for uti, but will check ua  I spent 31 minutes in preparing to see the patient by review of recent labs, imaging and procedures, obtaining and reviewing separately obtained history, communicating with the patient and family or caregiver, ordering medications, tests or procedures, and documenting clinical information in the EHR including the differential Dx, treatment, and any further evaluation and other management of gross hematuria, hyperglycemia, skin lesion, htn

## 2019-09-11 NOTE — Patient Instructions (Signed)
Please take all new medication as prescribed - the lisinopril HCT for blood pressure  Make sure to check your BP at home on a regular basis, with the goal being to be less than 140/90  Please continue all other medications as before, and refills have been done if requested.  Please have the pharmacy call with any other refills you may need.  Please continue your efforts at being more active, low cholesterol diet, and weight control.  You are otherwise up to date with prevention measures today.  Please keep your appointments with your specialists as you may have planned  You will be contacted regarding the referral for: dermatology  Please go to the LAB at the blood drawing area for the tests to be done - at the [place] will be contacted by phone if any changes need to be made immediately.  Otherwise, you will receive a letter about your results with an explanation, but please check with MyChart first.  Please remember to sign up for MyChart if you have not done so, as this will be important to you in the future with finding out test results, communicating by private email, and scheduling acute appointments online when needed.  Please make an Appointment to return in 6 months, or sooner if needed

## 2019-09-11 NOTE — Assessment & Plan Note (Signed)

## 2019-09-11 NOTE — Assessment & Plan Note (Signed)
stable overall by history and exam, recent data reviewed with pt, and pt to continue medical treatment as before,  to f/u any worsening symptoms or concerns  

## 2019-09-11 NOTE — Assessment & Plan Note (Signed)
Mod to severe uncontrolled, for wt loss, exercise, low salt and restart med as per cardiology, including the acei, but counseled to stop immediatlely if pregnant

## 2019-09-11 NOTE — Assessment & Plan Note (Signed)
Likely benign, but refer to derm as had been enlarging

## 2019-09-12 LAB — CBC WITH DIFFERENTIAL/PLATELET
Absolute Monocytes: 1101 cells/uL — ABNORMAL HIGH (ref 200–950)
Basophils Absolute: 57 cells/uL (ref 0–200)
Basophils Relative: 0.4 %
Eosinophils Absolute: 186 cells/uL (ref 15–500)
Eosinophils Relative: 1.3 %
HCT: 50.4 % — ABNORMAL HIGH (ref 35.0–45.0)
Hemoglobin: 16.7 g/dL — ABNORMAL HIGH (ref 11.7–15.5)
Lymphs Abs: 3775 cells/uL (ref 850–3900)
MCH: 28.5 pg (ref 27.0–33.0)
MCHC: 33.1 g/dL (ref 32.0–36.0)
MCV: 86 fL (ref 80.0–100.0)
MPV: 11.5 fL (ref 7.5–12.5)
Monocytes Relative: 7.7 %
Neutro Abs: 9181 cells/uL — ABNORMAL HIGH (ref 1500–7800)
Neutrophils Relative %: 64.2 %
Platelets: 262 10*3/uL (ref 140–400)
RBC: 5.86 10*6/uL — ABNORMAL HIGH (ref 3.80–5.10)
RDW: 14.1 % (ref 11.0–15.0)
Total Lymphocyte: 26.4 %
WBC: 14.3 10*3/uL — ABNORMAL HIGH (ref 3.8–10.8)

## 2019-09-12 LAB — HEPATIC FUNCTION PANEL
AG Ratio: 1.3 (calc) (ref 1.0–2.5)
ALT: 30 U/L — ABNORMAL HIGH (ref 6–29)
AST: 38 U/L — ABNORMAL HIGH (ref 10–30)
Albumin: 4.4 g/dL (ref 3.6–5.1)
Alkaline phosphatase (APISO): 101 U/L (ref 31–125)
Bilirubin, Direct: 0.1 mg/dL (ref 0.0–0.2)
Globulin: 3.5 g/dL (calc) (ref 1.9–3.7)
Indirect Bilirubin: 0.2 mg/dL (calc) (ref 0.2–1.2)
Total Bilirubin: 0.3 mg/dL (ref 0.2–1.2)
Total Protein: 7.9 g/dL (ref 6.1–8.1)

## 2019-09-12 LAB — MICROSCOPIC MESSAGE

## 2019-09-12 LAB — URINALYSIS, ROUTINE W REFLEX MICROSCOPIC
Bilirubin Urine: NEGATIVE
Glucose, UA: NEGATIVE
Hyaline Cast: NONE SEEN /LPF
Ketones, ur: NEGATIVE
Nitrite: NEGATIVE
Specific Gravity, Urine: 1.024 (ref 1.001–1.03)
pH: 6.5 (ref 5.0–8.0)

## 2019-09-12 LAB — BASIC METABOLIC PANEL
BUN: 15 mg/dL (ref 7–25)
CO2: 22 mmol/L (ref 20–32)
Calcium: 9.8 mg/dL (ref 8.6–10.2)
Chloride: 105 mmol/L (ref 98–110)
Creat: 0.77 mg/dL (ref 0.50–1.10)
Glucose, Bld: 82 mg/dL (ref 65–99)
Potassium: 4 mmol/L (ref 3.5–5.3)
Sodium: 137 mmol/L (ref 135–146)

## 2019-09-12 LAB — TSH: TSH: 1.24 m[IU]/L

## 2019-09-12 LAB — LIPID PANEL
Cholesterol: 209 mg/dL — ABNORMAL HIGH (ref <200)
HDL: 48 mg/dL — ABNORMAL LOW (ref >=50)
LDL Cholesterol (Calc): 140 mg/dL (calc) — ABNORMAL HIGH
Non-HDL Cholesterol (Calc): 161 mg/dL (calc) — ABNORMAL HIGH (ref <130)
Total CHOL/HDL Ratio: 4.4 (calc) (ref <5.0)
Triglycerides: 97 mg/dL (ref <150)

## 2019-09-12 LAB — HEMOGLOBIN A1C
Hgb A1c MFr Bld: 6.1 % of total Hgb — ABNORMAL HIGH (ref <5.7)
Mean Plasma Glucose: 128 (calc)
eAG (mmol/L): 7.1 (calc)

## 2019-09-12 LAB — URINE CULTURE
MICRO NUMBER:: 10693030
SPECIMEN QUALITY:: ADEQUATE

## 2019-09-12 LAB — HCG, QUANTITATIVE, PREGNANCY: HCG, Total, QN: <3 m[IU]/mL

## 2019-09-14 ENCOUNTER — Encounter: Payer: Self-pay | Admitting: Internal Medicine

## 2020-03-13 ENCOUNTER — Encounter: Payer: Self-pay | Admitting: Internal Medicine

## 2020-03-13 ENCOUNTER — Other Ambulatory Visit: Payer: Self-pay

## 2020-03-13 ENCOUNTER — Ambulatory Visit (INDEPENDENT_AMBULATORY_CARE_PROVIDER_SITE_OTHER): Payer: 59 | Admitting: Internal Medicine

## 2020-03-13 VITALS — BP 148/92 | HR 106 | Temp 98.9°F | Ht 67.0 in | Wt 342.0 lb

## 2020-03-13 DIAGNOSIS — G4730 Sleep apnea, unspecified: Secondary | ICD-10-CM

## 2020-03-13 DIAGNOSIS — I10 Essential (primary) hypertension: Secondary | ICD-10-CM

## 2020-03-13 DIAGNOSIS — E7800 Pure hypercholesterolemia, unspecified: Secondary | ICD-10-CM | POA: Diagnosis not present

## 2020-03-13 DIAGNOSIS — J309 Allergic rhinitis, unspecified: Secondary | ICD-10-CM

## 2020-03-13 DIAGNOSIS — R739 Hyperglycemia, unspecified: Secondary | ICD-10-CM

## 2020-03-13 DIAGNOSIS — H6982 Other specified disorders of Eustachian tube, left ear: Secondary | ICD-10-CM

## 2020-03-13 MED ORDER — HYDROCHLOROTHIAZIDE 12.5 MG PO CAPS: 12.5000 mg | ORAL_CAPSULE | Freq: Every day | ORAL | 3 refills | Status: DC

## 2020-03-13 MED ORDER — LISINOPRIL 40 MG PO TABS: 40.0000 mg | ORAL_TABLET | Freq: Every day | ORAL | 3 refills | Status: DC

## 2020-03-13 NOTE — Assessment & Plan Note (Signed)
To start wt watchers,  to f/u any worsening symptoms or concerns

## 2020-03-13 NOTE — Assessment & Plan Note (Signed)
To d/c afrin and sudafed, for allegra and nasacort asd,  to f/u any worsening symptoms or concerns

## 2020-03-13 NOTE — Progress Notes (Signed)
Established Patient Office Visit  Subjective:  Patient ID: Jennifer Allison, female    DOB: 09-06-1985  Age: 35 y.o. MRN: [id number]      Chief Complaint: (concise statement describing the symptom, problem, condition, diagnosis, physician recommended return, or other factor as reason for encounter) follow up HTN, HLD and hyperglycemia , as well as left ear fullness, allergy syjptoms, possible sleep apnea, and obesity       HPI:  Jennifer Allison is a 34 y.o. female here with worsening obesity due to excess calories and lack of significant activity, now to start wt watchers with mom and start more walking excercises soon.  Pt c/o left ear fullness but not really pain, denies reduced hearing, fever, dizziness, ST or cough.  Does have several wks ongoing nasal allergy symptoms with clearish congestion, itch and sneezing, without fever, pain, ST, cough, swelling or wheezing.  Taking sudafed and afrin, did not know about effect on BP.   Pt denies polydipsia, polyuria, Trying to follow lower cholesterol diet.  Also with snoring at night and daytime fatigue, was scheduled at one point for sleep study prior to covid, then did not happen.  No other new complaints.       Cue for cardiology f/u but does not have an appt. Wt Readings from Last 3 Encounters:  03/13/20 (!) 342 lb (155.1 kg)  09/11/19 (!) 333 lb (151 kg)  07/06/18 (!) 326 lb 11.2 oz (148.2 kg)   BP Readings from Last 3 Encounters:  03/13/20 (!) 148/92  09/11/19 (!) 180/120  07/06/18 (!) 141/109    Past Medical History:  Diagnosis Date  . Anxiety 01/12/2017  . Hyperlipidemia 05/27/2011  . Hypertension   . Migraines   . Morbid obesity ([place])    History reviewed. No pertinent surgical history.  reports that she has been smoking. She has never used smokeless tobacco. She reports current alcohol use. She reports that she does not use drugs. family history includes Cancer in an other family member; Diabetes in some other family members;  Hyperlipidemia in her father and another family member; Hypertension in her father and another family member; Mental illness in an other family member; Sudden death in an other family member. Allergies  Allergen Reactions  . Penicillins Hives  . Amlodipine Other (See Comments)    Worsening migraines, dizziness   Current Outpatient Medications on File Prior to Visit  Medication Sig Dispense Refill  . ALPRAZolam (XANAX) 0.25 MG tablet Take 1 tablet (0.25 mg total) by mouth 2 (two) times daily as needed for anxiety. 40 tablet 0   No current facility-administered medications on file prior to visit.        ROS:  All others reviewed and negative.  Objective        PE:  BP (!) 148/92 (BP Location: Left Arm)   Pulse (!) 106   Temp 98.9 F (37.2 C) (Oral)   Ht 5\' 7"  (1.702 m)   Wt (!) 342 lb (155.1 kg)   SpO2 96%   BMI 53.56 kg/m                 Constitutional: Pt appears in NAD               HENT: Head: NCAT.                Right Ear: External ear normal.                 Left Ear: External ear normal.  Left TM with mild erythema and post effusion               Eyes: . Pupils are equal, round, and reactive to light. Conjunctivae and EOM are normal               Nose: without d/c or deformity               Neck: Neck supple. Gross normal ROM               Cardiovascular: Normal rate and regular rhythm.                 Pulmonary/Chest: Effort normal and breath sounds without rales or wheezing.                Abd:  Soft, NT, ND, + BS, no organomegaly               Neurological: Pt is alert. At baseline orientation, motor grossly intact               Skin: Skin is warm. No rashes, no other new lesions, LE edema - none               Psychiatric: Pt behavior is normal without agitation   Assessment/Plan:  Jennifer Allison is a 35 y.o. Black or African American [2] female with  has a past medical history of Anxiety (01/12/2017), Hyperlipidemia (05/27/2011), Hypertension, Migraines, and Morbid  obesity ([place]).   Assessment Plan  See problem oriented assessment and plan Labs reviewed for each problem: Lab Results  Component Value Date   WBC 14.3 (H) 09/11/2019   HGB 16.7 (H) 09/11/2019   HCT 50.4 (H) 09/11/2019   PLT 262 09/11/2019   GLUCOSE 82 09/11/2019   CHOL 209 (H) 09/11/2019   TRIG 97 09/11/2019   HDL 48 (L) 09/11/2019   LDLCALC 140 (H) 09/11/2019   ALT 30 (H) 09/11/2019   AST 38 (H) 09/11/2019   NA 137 09/11/2019   K 4.0 09/11/2019   CL 105 09/11/2019   CREATININE 0.77 09/11/2019   BUN 15 09/11/2019   CO2 22 09/11/2019   TSH 1.24 09/11/2019   HGBA1C 6.1 (H) 09/11/2019    Micro: none  Cardiac tracings I have personally interpreted today:  none  Pertinent Radiological findings (summarize): none   I spent total 46 minutes in caring for the patient for this visit:  1) by communicating with the patient during the visit  2) by review of pertinent vital sign data, physical examination and labs as documented in the assessment and plan  3) by review of pertinent imaging - none today  4) by review of pertinent procedures - none today  5) by obtaining and reviewing separately obtained information from family/caretaker and Care Everywhere - none today  6) by ordering medications  7) by ordering tests  8) by documenting all of this clinical information in the EHR including the management of each problem noted today in assessment and plan   Health Maintenance Due  Topic Date Due  . Hepatitis C Screening  Never done  . COVID-19 Vaccine (3 - Moderna risk 4-dose series) 07/17/2019    There are no preventive care reminders to display for this patient.  Lab Results  Component Value Date   TSH 1.24 09/11/2019   Lab Results  Component Value Date   WBC 14.3 (H) 09/11/2019   HGB 16.7 (H) 09/11/2019   HCT 50.4 (H) 09/11/2019   MCV  86.0 09/11/2019   PLT 262 09/11/2019   Lab Results  Component Value Date   NA 137 09/11/2019   K 4.0 09/11/2019   CO2 22  09/11/2019   GLUCOSE 82 09/11/2019   BUN 15 09/11/2019   CREATININE 0.77 09/11/2019   BILITOT 0.3 09/11/2019   ALKPHOS 92 07/06/2018   AST 38 (H) 09/11/2019   ALT 30 (H) 09/11/2019   PROT 7.9 09/11/2019   ALBUMIN 4.1 07/06/2018   CALCIUM 9.8 09/11/2019   ANIONGAP 13 07/06/2018   GFR 87.06 06/15/2018   Lab Results  Component Value Date   CHOL 209 (H) 09/11/2019   Lab Results  Component Value Date   HDL 48 (L) 09/11/2019   Lab Results  Component Value Date   LDLCALC 140 (H) 09/11/2019   Lab Results  Component Value Date   TRIG 97 09/11/2019   Lab Results  Component Value Date   CHOLHDL 4.4 09/11/2019   Lab Results  Component Value Date   HGBA1C 6.1 (H) 09/11/2019      Assessment & Plan:   Problem List Items Addressed This Visit      Medium   Sleep-disordered breathing    For pulm referral, r/o OSA      Relevant Orders   Ambulatory referral to Pulmonology   Morbid obesity ([place])    To start wt watchers,  to f/u any worsening symptoms or concerns      Hypertension    BP Readings from Last 3 Encounters:  03/13/20 (!) 148/92  09/11/19 (!) 180/120  07/06/18 (!) 141/109  moderately elevated, pt no longer on BCPs and has intermittent intercourse with possible chance for pregnancy, started on ACE per cardiology, pt declines change in med except to continue current med increased to lisinopril 40 qd, hct 12qd and f/u cardiology, and accepts risk of doing this      Relevant Medications   lisinopril (ZESTRIL) 40 MG tablet   hydrochlorothiazide (MICROZIDE) 12.5 MG capsule   Hyperlipidemia    Uncontrolled, for f/u lipids, low chol diet, declines statin Lab Results  Component Value Date   LDLCALC 140 (H) 09/11/2019        Relevant Medications   lisinopril (ZESTRIL) 40 MG tablet   hydrochlorothiazide (MICROZIDE) 12.5 MG capsule   Hyperglycemia - Primary    Lab Results  Component Value Date   HGBA1C 6.1 (H) 09/11/2019  stable, cont diet and wt control       Allergic rhinitis    To d/c afrin and sudafed, for allegra and nasacort asd,  to f/u any worsening symptoms or concerns        Low   Eustachian tube dysfunction, left    For mucinex bid prn,  to f/u any worsening symptoms or concerns         Meds ordered this encounter  Medications  . lisinopril (ZESTRIL) 40 MG tablet    Sig: Take 1 tablet (40 mg total) by mouth daily.    Dispense:  90 tablet    Refill:  3  . hydrochlorothiazide (MICROZIDE) 12.5 MG capsule    Sig: Take 1 capsule (12.5 mg total) by mouth daily.    Dispense:  90 capsule    Refill:  3    Follow-up: Return in about 6 months (around 09/10/2020).    Cathlean Cower, MD 03/13/2020 9:09 PM [facility]

## 2020-03-13 NOTE — Patient Instructions (Signed)
Ok to stop the afrin and sudafed  Please take all new medication as prescribed - OTC mucinex twice per day, and Allegra 180 mg and nasacort for sinus allergies to help get the ear "unclogged"  Ok to stop the lisinopril HCT  Please take all new medication as prescribed - the lisinopril 40 mg per day, and the HCT 12.5 mg per day  Please continue all other medications as before, and refills have been done if requested.  Please have the pharmacy call with any other refills you may need.  Please continue your efforts at being more active, low cholesterol diet, and weight control - with Weight Watchers as you discussed  Please keep your appointments with your specialists as you may have planned - Cardiology  You will be contacted regarding the referral for: Pulmonary to see about sleep apnea, which can make your BP harder to control  Please make an Appointment to return in 6 months, or sooner if needed

## 2020-03-13 NOTE — Assessment & Plan Note (Signed)
Uncontrolled, for f/u lipids, low chol diet, declines statin Lab Results  Component Value Date   LDLCALC 140 (H) 09/11/2019

## 2020-03-13 NOTE — Assessment & Plan Note (Addendum)
BP Readings from Last 3 Encounters:  03/13/20 (!) 148/92  09/11/19 (!) 180/120  07/06/18 (!) 141/109  moderately elevated, pt no longer on BCPs and has intermittent intercourse with possible chance for pregnancy, started on ACE per cardiology, pt declines change in med except to continue current med increased to lisinopril 40 qd, hct 12qd and f/u cardiology, and accepts risk of doing this

## 2020-03-13 NOTE — Assessment & Plan Note (Signed)
For mucinex bid prn,  to f/u any worsening symptoms or concerns

## 2020-03-13 NOTE — Assessment & Plan Note (Signed)
For pulm referral, r/o OSA

## 2020-03-13 NOTE — Assessment & Plan Note (Signed)
Lab Results  Component Value Date   HGBA1C 6.1 (H) 09/11/2019  stable, cont diet and wt control

## 2020-10-28 ENCOUNTER — Ambulatory Visit (INDEPENDENT_AMBULATORY_CARE_PROVIDER_SITE_OTHER): Payer: 59 | Admitting: Internal Medicine

## 2020-10-28 ENCOUNTER — Encounter: Payer: Self-pay | Admitting: Internal Medicine

## 2020-10-28 ENCOUNTER — Other Ambulatory Visit: Payer: Self-pay

## 2020-10-28 VITALS — BP 158/110 | HR 80 | Temp 98.4°F | Ht 67.0 in | Wt 338.0 lb

## 2020-10-28 DIAGNOSIS — M5412 Radiculopathy, cervical region: Secondary | ICD-10-CM | POA: Diagnosis not present

## 2020-10-28 DIAGNOSIS — I10 Essential (primary) hypertension: Secondary | ICD-10-CM

## 2020-10-28 DIAGNOSIS — R739 Hyperglycemia, unspecified: Secondary | ICD-10-CM | POA: Diagnosis not present

## 2020-10-28 MED ORDER — PREDNISONE 10 MG PO TABS: ORAL_TABLET | ORAL | 0 refills | Status: DC

## 2020-10-28 MED ORDER — CYCLOBENZAPRINE HCL 5 MG PO TABS: 5.0000 mg | ORAL_TABLET | Freq: Three times a day (TID) | ORAL | Status: DC | PRN

## 2020-10-28 MED ORDER — TRAMADOL HCL 50 MG PO TABS: 50.0000 mg | ORAL_TABLET | Freq: Four times a day (QID) | ORAL | 0 refills | Status: DC | PRN

## 2020-10-28 NOTE — Patient Instructions (Signed)
Please take all new medication as prescribed - the tramadol for pain, flexeril muscle relaxer and prednisone  Please continue all other medications as before, and refills have been done if requested.  Please have the pharmacy call with any other refills you may need.  Please continue your efforts at being more active, low cholesterol diet, and weight control.  Please keep your appointments with your specialists as you may have planned

## 2020-10-28 NOTE — Progress Notes (Signed)
Patient ID: Jennifer Allison, female   DOB: [birth year], 35 y.o.   MRN: [id number]        Chief Complaint: follow up left neck and upper back pain       HPI:  Jennifer Allison is a 35 y.o. female here with c/o 1 wk onset new complaint of left post lateral neck and upper back pain, mild to mod, constant, with some radiation to the left arm with tingling discomfort, but no weakness or loss of grip strength.  Not better with lidocaine patch, heat pad, alleve.  Nothing else makes better or worse.  BP at home < 140/90.   Pt denies polydipsia, polyuria, or new focal neuro s/s.       Wt Readings from Last 3 Encounters:  10/28/20 (!) 338 lb (153.3 kg)  03/13/20 (!) 342 lb (155.1 kg)  09/11/19 (!) 333 lb (151 kg)   BP Readings from Last 3 Encounters:  10/28/20 (!) 158/110  03/13/20 (!) 148/92  09/11/19 (!) 180/120         Past Medical History:  Diagnosis Date   Anxiety 01/12/2017   Hyperlipidemia 05/27/2011   Hypertension    Migraines    Morbid obesity ([place])    History reviewed. No pertinent surgical history.  reports that she has been smoking. She has never used smokeless tobacco. She reports current alcohol use. She reports that she does not use drugs. family history includes Cancer in an other family member; Diabetes in some other family members; Hyperlipidemia in her father and another family member; Hypertension in her father and another family member; Mental illness in an other family member; Sudden death in an other family member. Allergies  Allergen Reactions   Penicillins Hives   Amlodipine Other (See Comments)    Worsening migraines, dizziness   Current Outpatient Medications on File Prior to Visit  Medication Sig Dispense Refill   ALPRAZolam (XANAX) 0.25 MG tablet Take 1 tablet (0.25 mg total) by mouth 2 (two) times daily as needed for anxiety. 40 tablet 0   hydrochlorothiazide (MICROZIDE) 12.5 MG capsule Take 1 capsule (12.5 mg total) by mouth daily. 90 capsule 3   Lidocaine 1.8 %  PTCH Apply topically.     lisinopril (ZESTRIL) 40 MG tablet Take 1 tablet (40 mg total) by mouth daily. 90 tablet 3   No current facility-administered medications on file prior to visit.        ROS:  All others reviewed and negative.  Objective        PE:  BP (!) 158/110 (BP Location: Right Arm, Patient Position: Sitting, Cuff Size: Large)   Pulse 80   Temp 98.4 F (36.9 C) (Oral)   Ht '5\' 7"'$  (1.702 m)   Wt (!) 338 lb (153.3 kg)   SpO2 97%   BMI 52.94 kg/m                 Constitutional: Pt appears in NAD               HENT: Head: NCAT.                Right Ear: External ear normal.                 Left Ear: External ear normal.                Eyes: . Pupils are equal, round, and reactive to light. Conjunctivae and EOM are normal  Nose: without d/c or deformity               Neck: Neck supple. Gross normal ROM, nontender , no rash               Cardiovascular: Normal rate and regular rhythm.                 Pulmonary/Chest: Effort normal and breath sounds without rales or wheezing.                               Neurological: Pt is alert. At baseline orientation, motor grossly intact               Skin: Skin is warm. No rashes, no other new lesions, LE edema - none               Psychiatric: Pt behavior is normal without agitation   Micro: none  Cardiac tracings I have personally interpreted today:  none  Pertinent Radiological findings (summarize): none   Lab Results  Component Value Date   WBC 14.3 (H) 09/11/2019   HGB 16.7 (H) 09/11/2019   HCT 50.4 (H) 09/11/2019   PLT 262 09/11/2019   GLUCOSE 82 09/11/2019   CHOL 209 (H) 09/11/2019   TRIG 97 09/11/2019   HDL 48 (L) 09/11/2019   LDLCALC 140 (H) 09/11/2019   ALT 30 (H) 09/11/2019   AST 38 (H) 09/11/2019   NA 137 09/11/2019   K 4.0 09/11/2019   CL 105 09/11/2019   CREATININE 0.77 09/11/2019   BUN 15 09/11/2019   CO2 22 09/11/2019   TSH 1.24 09/11/2019   HGBA1C 6.1 (H) 09/11/2019    Assessment/Plan:  Jennifer Allison is a 35 y.o. Black or African American [2] female with  has a past medical history of Anxiety (01/12/2017), Hyperlipidemia (05/27/2011), Hypertension, Migraines, and Morbid obesity ([place]).  Left cervical radiculopathy Mild to mod, 1 wk, for tramadol prn, muscle relaxer prn and prednisone short course, consider MRI if persistent or worsens  Hypertension BP Readings from Last 3 Encounters:  10/28/20 (!) 158/110  03/13/20 (!) 148/92  09/11/19 (!) 180/120   Uncontrolled here, pt states controlled at home, pt to continue medical treatment zestril, hct   Hyperglycemia Lab Results  Component Value Date   HGBA1C 6.1 (H) 09/11/2019   Stable, pt to continue current medical treatment  - diet  Followup: Return if symptoms worsen or fail to improve.  Cathlean Cower, MD 10/29/2020 9:48 PM [facility]

## 2020-10-29 ENCOUNTER — Encounter: Payer: Self-pay | Admitting: Internal Medicine

## 2020-10-29 NOTE — Assessment & Plan Note (Signed)
Mild to mod, 1 wk, for tramadol prn, muscle relaxer prn and prednisone short course, consider MRI if persistent or worsens

## 2020-10-29 NOTE — Assessment & Plan Note (Signed)
Lab Results  Component Value Date   HGBA1C 6.1 (H) 09/11/2019   Stable, pt to continue current medical treatment  - diet

## 2020-10-29 NOTE — Assessment & Plan Note (Signed)
BP Readings from Last 3 Encounters:  10/28/20 (!) 158/110  03/13/20 (!) 148/92  09/11/19 (!) 180/120   Uncontrolled here, pt states controlled at home, pt to continue medical treatment zestril, hct

## 2020-11-11 ENCOUNTER — Ambulatory Visit (HOSPITAL_COMMUNITY)
Admission: EM | Admit: 2020-11-11 | Discharge: 2020-11-11 | Disposition: A | Discharge status: Home / Self Care | Payer: 59 | Attending: Emergency Medicine | Admitting: Emergency Medicine

## 2020-11-11 ENCOUNTER — Encounter (HOSPITAL_COMMUNITY): Payer: Self-pay | Admitting: Emergency Medicine

## 2020-11-11 DIAGNOSIS — M5412 Radiculopathy, cervical region: Secondary | ICD-10-CM | POA: Diagnosis not present

## 2020-11-11 MED ORDER — KETOROLAC TROMETHAMINE 30 MG/ML IJ SOLN
INTRAMUSCULAR | Status: AC
  Filled 2020-11-11: qty 1

## 2020-11-11 MED ORDER — PREDNISONE 20 MG PO TABS: 40.0000 mg | ORAL_TABLET | Freq: Every day | ORAL | 0 refills | Status: DC

## 2020-11-11 MED ORDER — KETOROLAC TROMETHAMINE 30 MG/ML IJ SOLN
30.0000 mg | Freq: Once | INTRAMUSCULAR | Status: AC
  Administered 2020-11-11: 30 mg via INTRAMUSCULAR

## 2020-11-11 NOTE — Discharge Instructions (Addendum)
Starting tomorrow take prednisone 40 mg daily with food plan  Can take 10 mg of Flexeril up to 3 times a day as needed for additional comfort, be mindful this medication may make you drowsy  Keep appointment with your primary provider for evaluation and ideally need for MRI, also discussed with primary doctor need for involvement of orthopedic specialist  Monitor blood pressure due to elevation, incomplete if you begin to have blurred vision started seeing spots have a severe headache having abdominal pain with vomiting, chest pain or shortness of breath please go to the nearest emergency department for evaluation

## 2020-11-11 NOTE — ED Triage Notes (Signed)
Pt reports for over week having neck pains that radiates down left arm causing numbness. Was given muscle relaxer,  tramadol and prednisone. Denies much relief. Has appt to see them on Thursday. Pt also c/o left hand swelling for a couple days

## 2020-11-11 NOTE — ED Provider Notes (Signed)
[place]    CSN: [id number] Arrival date & time: 11/11/20  1648      History   Chief Complaint Chief Complaint  Patient presents with   Neck Pain   Arm Pain    HPI Jennifer Allison is a 35 y.o. female.   Patient presents with left-sided neck pain radiating down the left arm causing numbness specifically to the fourth and fifth finger with sensation of swelling to the hand for 3 weeks.  Denies precipitating event, prior injury or trauma.  Cervical range of motion intact but does elicit pain, range of motion of left shoulder and arm intact.  Has been seen by PCP for same symptoms with no change in symptomology, was prescribed tramadol and Flexeril with minimal relief.  Has upcoming appointment in 2 days.  History of anxiety, hyperlipidemia, hypertension, migraines, morbid obesity.  Past Medical History:  Diagnosis Date   Anxiety 01/12/2017   Hyperlipidemia 05/27/2011   Hypertension    Migraines    Morbid obesity [facility])     Patient Active Problem List   Diagnosis Date Noted   Left cervical radiculopathy 10/28/2020   Eustachian tube dysfunction, left 03/13/2020   Allergic rhinitis 03/13/2020   Morbid obesity ([place]) 03/13/2020   Sleep-disordered breathing 03/13/2020   Gross hematuria 09/11/2019   Arm skin lesion, right 09/11/2019   Dysuria 06/27/2018   Benign skin mole 06/15/2018   Hyperglycemia 06/15/2018   Hypertension    Anxiety 01/12/2017   Polycythemia 01/12/2017   Leukocytosis 01/12/2017   Daytime somnolence 01/12/2017   Palpitations 11/18/2011   Dyspnea 11/18/2011   Right leg pain 11/18/2011   Hyperlipidemia 05/27/2011   Encounter for well adult exam with abnormal findings 05/23/2011    History reviewed. No pertinent surgical history.  OB History   No obstetric history on file.      Home Medications    Prior to Admission medications   Medication Sig Start Date End Date Taking? Authorizing Provider  predniSONE (DELTASONE) 20 MG tablet Take  2 tablets (40 mg total) by mouth daily. 11/11/20  Yes Arvel Oquinn, Leitha Schuller, NP  ALPRAZolam (XANAX) 0.25 MG tablet Take 1 tablet (0.25 mg total) by mouth 2 (two) times daily as needed for anxiety. 10/20/18   Biagio Borg, MD  cyclobenzaprine (FLEXERIL) 5 MG tablet Take 1 tablet (5 mg total) by mouth 3 (three) times daily as needed for muscle spasms. 10/28/20   Biagio Borg, MD  hydrochlorothiazide (MICROZIDE) 12.5 MG capsule Take 1 capsule (12.5 mg total) by mouth daily. 03/13/20 03/13/21  Biagio Borg, MD  Lidocaine 1.8 % PTCH Apply topically.    [provider]  lisinopril (ZESTRIL) 40 MG tablet Take 1 tablet (40 mg total) by mouth daily. 03/13/20   Biagio Borg, MD  traMADol (ULTRAM) 50 MG tablet Take 1 tablet (50 mg total) by mouth every 6 (six) hours as needed. 10/28/20   Biagio Borg, MD    Family History Family History  Problem Relation Age of Onset   Hypertension Father    Hyperlipidemia Father    Hypertension Other    Mental illness Other    Hyperlipidemia Other    Diabetes Other    Cancer Other        prostate   Sudden death Other    Diabetes Other     Social History Social History   Tobacco Use   Smoking status: Light Smoker   Smokeless tobacco: Never  Substance Use Topics   Alcohol use:  Yes    Comment: social   Drug use: No     Allergies   Penicillins and Amlodipine   Review of Systems Review of Systems  Constitutional: Negative.   HENT: Negative.    Respiratory: Negative.    Musculoskeletal:  Positive for neck pain. Negative for arthralgias, back pain, gait problem, joint swelling, myalgias and neck stiffness.  Skin: Negative.   Neurological:  Positive for numbness. Negative for dizziness, tremors, seizures, syncope, facial asymmetry, speech difficulty, weakness, light-headedness and headaches.    Physical Exam Triage Vital Signs ED Triage Vitals  Enc Vitals Group     BP 11/11/20 1658 (!) 180/84     Pulse Rate 11/11/20 1658 100     Resp  11/11/20 1658 20     Temp 11/11/20 1658 98.3 F (36.8 C)     Temp Source 11/11/20 1658 Oral     SpO2 11/11/20 1658 95 %     Weight --      Height --      Head Circumference --      Peak Flow --      Pain Score 11/11/20 1656 9     Pain Loc --      Pain Edu? --      Excl. in [place]? --    No data found.  Updated Vital Signs BP (!) 180/84 (BP Location: Right Arm)   Pulse 100   Temp 98.3 F (36.8 C) (Oral)   Resp 20   LMP 10/16/2020   SpO2 95%   Visual Acuity Right Eye Distance:   Left Eye Distance:   Bilateral Distance:    Right Eye Near:   Left Eye Near:    Bilateral Near:     Physical Exam Constitutional:      Appearance: Normal appearance. She is normal weight.  HENT:     Head: Normocephalic.  Eyes:     Extraocular Movements: Extraocular movements intact.  Pulmonary:     Effort: Pulmonary effort is normal.  Musculoskeletal:     Comments: Unable to elicit tenderness on palpation, no swelling, erythema, ecchymosis, spasming noted, range of motion intact, no rigidity or crepitus noted  Skin:    General: Skin is warm and dry.  Neurological:     Mental Status: She is alert and oriented to person, place, and time. Mental status is at baseline.  Psychiatric:        Mood and Affect: Mood normal.        Behavior: Behavior normal.     UC Treatments / Results  Labs (all labs ordered are listed, but only abnormal results are displayed) Labs Reviewed - No data to display  EKG   Radiology No results found.  Procedures Procedures (including critical care time)  Medications Ordered in UC Medications  ketorolac (TORADOL) 30 MG/ML injection 30 mg (30 mg Intramuscular Given 11/11/20 1812)    Initial Impression / Assessment and Plan / UC Course  I have reviewed the triage vital signs and the nursing notes.  Pertinent labs & imaging results that were available during my care of the patient were reviewed by me and considered in my medical decision making (see chart  for details).  Left cervical radiculopathy  Discussed with patient that she has been managed appropriately by PCP, per note PCP is considering MRI as neck step of treatment, in agreement with this plan and have discussed with patient to keep upcoming appointment, also advised patient to discuss with primary care doctor about adding on  additional orthopedic specialist for continued management  1.  Prednisone 40 mg daily for 5 days 2.  Advised increasing dose of Flexeril to 10 mg 3 times a day as needed for additional comfort, patient recently picked up current prescription of Flexeril 5 mg therefore I will not write new prescription 3.  Patient blood pressure elevated today at visit, per records patient blood pressure is typically elevated however not normally in the 180s, this elevation could be due to to pain, patient instructed to monitor and to follow-up with primary care doctor, patient given strict precautions for any signs of hypertensive urgency should follow-up at her nearest emergency department for evaluation 4.  Toradol 30 mg IM now Final Clinical Impressions(s) / UC Diagnoses   Final diagnoses:  Left cervical radiculopathy     Discharge Instructions      Starting tomorrow take prednisone 40 mg daily with food plan  Can take 10 mg of Flexeril up to 3 times a day as needed for additional comfort, be mindful this medication may make you drowsy  Keep appointment with your primary provider for evaluation and ideally need for MRI, also discussed with primary doctor need for involvement of orthopedic specialist  Monitor blood pressure due to elevation, incomplete if you begin to have blurred vision started seeing spots have a severe headache having abdominal pain with vomiting, chest pain or shortness of breath please go to the nearest emergency department for evaluation   ED Prescriptions     Medication Sig Dispense Auth. Provider   predniSONE (DELTASONE) 20 MG tablet Take 2  tablets (40 mg total) by mouth daily. 10 tablet Hans Eden, NP      PDMP not reviewed this encounter.   Hans Eden, NP 11/11/20 [phone]

## 2020-11-14 ENCOUNTER — Ambulatory Visit (INDEPENDENT_AMBULATORY_CARE_PROVIDER_SITE_OTHER): Payer: 59 | Admitting: Internal Medicine

## 2020-11-14 ENCOUNTER — Other Ambulatory Visit: Payer: Self-pay

## 2020-11-14 ENCOUNTER — Encounter: Payer: Self-pay | Admitting: Internal Medicine

## 2020-11-14 VITALS — BP 150/98 | HR 102 | Temp 98.6°F | Ht 67.0 in | Wt 339.0 lb

## 2020-11-14 DIAGNOSIS — M79602 Pain in left arm: Secondary | ICD-10-CM | POA: Insufficient documentation

## 2020-11-14 DIAGNOSIS — M7989 Other specified soft tissue disorders: Secondary | ICD-10-CM

## 2020-11-14 DIAGNOSIS — M5412 Radiculopathy, cervical region: Secondary | ICD-10-CM | POA: Diagnosis not present

## 2020-11-14 DIAGNOSIS — I10 Essential (primary) hypertension: Secondary | ICD-10-CM | POA: Diagnosis not present

## 2020-11-14 MED ORDER — HYDROCODONE-ACETAMINOPHEN 7.5-325 MG PO TABS: 1.0000 | ORAL_TABLET | Freq: Four times a day (QID) | ORAL | 0 refills | Status: DC | PRN

## 2020-11-14 NOTE — Progress Notes (Signed)
Patient ID: Jennifer Allison, female   DOB: 1985-09-24, 35 y.o.   MRN: [id number]        Chief Complaint: follow up new onset left arm tight swelling       HPI:  Jennifer Allison is a 35 y.o. female here with above onset x 1-2 days, Pt denies chest pain, increased sob or doe, wheezing, orthopnea, PND, increased LE swelling, palpitations, dizziness or syncope.  Seen in ED sept 12 with suggestion of left cervical radiculopathy as also aug 29 here, but now arm is tight with swelling as well.  Not taking BCPs.  Recent tx for prednisone, tramadol, muscle relaxer not helping this.  BP also mildly elevated with pain it seems per pt.  Left arm pain, swelling not really better with anything, and seems worse at night last night, overall mod, constant  Pt denies chest pain, increased sob or doe, wheezing, orthopnea, PND, increased LE swelling, palpitations, dizziness or syncope.   Pt denies polydipsia, polyuria, or new focal neuro s/s.   Pt denies fever, wt loss, night sweats, loss of appetite, or other constitutional symptoms    no prior hx of same.  Wt Readings from Last 3 Encounters:  11/14/20 (!) 339 lb (153.8 kg)  10/28/20 (!) 338 lb (153.3 kg)  03/13/20 (!) 342 lb (155.1 kg)   BP Readings from Last 3 Encounters:  11/14/20 (!) 150/98  11/11/20 (!) 180/84  10/28/20 (!) 158/110         Past Medical History:  Diagnosis Date   Anxiety 01/12/2017   Hyperlipidemia 05/27/2011   Hypertension    Migraines    Morbid obesity ([place])    History reviewed. No pertinent surgical history.  reports that she has been smoking. She has never used smokeless tobacco. She reports current alcohol use. She reports that she does not use drugs. family history includes Cancer in an other family member; Diabetes in some other family members; Hyperlipidemia in her father and another family member; Hypertension in her father and another family member; Mental illness in an other family member; Sudden death in an other family  member. Allergies  Allergen Reactions   Penicillins Hives   Amlodipine Other (See Comments)    Worsening migraines, dizziness   Current Outpatient Medications on File Prior to Visit  Medication Sig Dispense Refill   ALPRAZolam (XANAX) 0.25 MG tablet Take 1 tablet (0.25 mg total) by mouth 2 (two) times daily as needed for anxiety. 40 tablet 0   cyclobenzaprine (FLEXERIL) 5 MG tablet Take 1 tablet (5 mg total) by mouth 3 (three) times daily as needed for muscle spasms. 40 tablet 02   hydrochlorothiazide (MICROZIDE) 12.5 MG capsule Take 1 capsule (12.5 mg total) by mouth daily. 90 capsule 3   Lidocaine 1.8 % PTCH Apply topically.     lisinopril (ZESTRIL) 40 MG tablet Take 1 tablet (40 mg total) by mouth daily. 90 tablet 3   predniSONE (DELTASONE) 20 MG tablet Take 2 tablets (40 mg total) by mouth daily. 10 tablet 0   traMADol (ULTRAM) 50 MG tablet Take 1 tablet (50 mg total) by mouth every 6 (six) hours as needed. 30 tablet 0   No current facility-administered medications on file prior to visit.        ROS:  All others reviewed and negative.  Objective        PE:  BP (!) 150/98 (BP Location: Right Arm, Patient Position: Sitting, Cuff Size: Large)   Pulse (!) 102   Temp 98.6 F (  37 C) (Oral)   Ht '5\' 7"'$  (1.702 m)   Wt (!) 339 lb (153.8 kg)   LMP 10/16/2020   SpO2 95%   BMI 53.09 kg/m                 Constitutional: Pt appears in NAD               HENT: Head: NCAT.                Right Ear: External ear normal.                 Left Ear: External ear normal.                Eyes: . Pupils are equal, round, and reactive to light. Conjunctivae and EOM are normal               Nose: without d/c or deformity               Neck: Neck supple. Gross normal ROM               Cardiovascular: Normal rate and regular rhythm.                 Pulmonary/Chest: Effort normal and breath sounds without rales or wheezing.                Abd:  Soft, NT, ND, + BS, no organomegaly                Neurological: Pt is alert. At baseline orientation, motor grossly intact               Skin: Skin is warm. No rashes, no other new lesions, LE edema - none but LUE from shoulder to hand with 1-2+ swelling and tightness without skin redness, red streaks, or drainage               Psychiatric: Pt behavior is normal without agitation   Micro: none  Cardiac tracings I have personally interpreted today:  none  Pertinent Radiological findings (summarize): none   Lab Results  Component Value Date   WBC 14.3 (H) 09/11/2019   HGB 16.7 (H) 09/11/2019   HCT 50.4 (H) 09/11/2019   PLT 262 09/11/2019   GLUCOSE 82 09/11/2019   CHOL 209 (H) 09/11/2019   TRIG 97 09/11/2019   HDL 48 (L) 09/11/2019   LDLCALC 140 (H) 09/11/2019   ALT 30 (H) 09/11/2019   AST 38 (H) 09/11/2019   NA 137 09/11/2019   K 4.0 09/11/2019   CL 105 09/11/2019   CREATININE 0.77 09/11/2019   BUN 15 09/11/2019   CO2 22 09/11/2019   TSH 1.24 09/11/2019   HGBA1C 6.1 (H) 09/11/2019   Assessment/Plan:  Jennifer Allison is a 35 y.o. Black or African American [2] female with  has a past medical history of Anxiety (01/12/2017), Hyperlipidemia (05/27/2011), Hypertension, Migraines, and Morbid obesity ([place]).  Left cervical radiculopathy Ok to continue current tx for this aspect including tramadol prn, also for MRI for possible worsening symptoms  Arm pain, left With recent onset pain and swelling, cant r/o DVT, needs Venous doppler asap, declines UC or ED at this time, given sample of eliquis 5 mg bid for which pt accepts risk and benefit x 2 pills only in order to get access to LUE study hopefully tomorrow,  to f/u any worsening symptoms or concerns  Hypertension Mild elevated, likely situational, continue to follow BP Readings  from Last 3 Encounters:  11/14/20 (!) 150/98  11/11/20 (!) 180/84  10/28/20 (!) 158/110  cont same tx - hct, zestril  Followup: Return if symptoms worsen or fail to improve.  Cathlean Cower, MD 11/18/2020  4:11 PM [facility]

## 2020-11-14 NOTE — Patient Instructions (Signed)
Please take the sample of Eliquis 5 mg once tonight, and then in the AM  Please take all new medication as prescribed - the pain medication  Please continue all other medications as before, and refills have been done if requested.  Please have the pharmacy call with any other refills you may need.  Please keep your appointments with your specialists as you may have planned  You will be contacted regarding the referral for: arm vein testing - asap  You will be contacted regarding the referral for: MRI for the neck

## 2020-11-18 ENCOUNTER — Ambulatory Visit (HOSPITAL_COMMUNITY)
Admission: RE | Admit: 2020-11-18 | Discharge: 2020-11-18 | Disposition: A | Discharge status: Home / Self Care | Payer: 59 | Source: Ambulatory Visit | Attending: Internal Medicine | Admitting: Internal Medicine

## 2020-11-18 ENCOUNTER — Encounter: Payer: Self-pay | Admitting: Internal Medicine

## 2020-11-18 ENCOUNTER — Other Ambulatory Visit: Payer: Self-pay

## 2020-11-18 DIAGNOSIS — M7989 Other specified soft tissue disorders: Secondary | ICD-10-CM

## 2020-11-18 DIAGNOSIS — M79602 Pain in left arm: Secondary | ICD-10-CM | POA: Diagnosis present

## 2020-11-18 NOTE — Assessment & Plan Note (Signed)
With recent onset pain and swelling, cant r/o DVT, needs Venous doppler asap, declines UC or ED at this time, given sample of eliquis 5 mg bid for which pt accepts risk and benefit x 2 pills only in order to get access to LUE study hopefully tomorrow,  to f/u any worsening symptoms or concerns

## 2020-11-18 NOTE — Assessment & Plan Note (Signed)
Mild elevated, likely situational, continue to follow BP Readings from Last 3 Encounters:  11/14/20 (!) 150/98  11/11/20 (!) 180/84  10/28/20 (!) 158/110  cont same tx - hct, zestril

## 2020-11-18 NOTE — Assessment & Plan Note (Addendum)
Ok to continue current tx for this aspect including tramadol prn, also for MRI for possible worsening symptoms

## 2020-11-19 ENCOUNTER — Encounter: Payer: Self-pay | Admitting: Internal Medicine

## 2020-11-19 DIAGNOSIS — M5412 Radiculopathy, cervical region: Secondary | ICD-10-CM

## 2020-11-27 ENCOUNTER — Other Ambulatory Visit: Payer: 59

## 2020-11-27 ENCOUNTER — Telehealth: Payer: Self-pay | Admitting: Internal Medicine

## 2020-11-27 NOTE — Telephone Encounter (Signed)
Type of form received: Home Health Certification   Form placed in: Provider mailbox  Additional instructions from the patient: fax- [phone]  Things to remember: [place] office: If form received in person, remind patient that forms take 7-10 business days CMA should attach charge sheet and put on Supervisor's desk

## 2020-11-30 ENCOUNTER — Other Ambulatory Visit: Payer: 59

## 2021-01-08 DIAGNOSIS — M542 Cervicalgia: Secondary | ICD-10-CM | POA: Insufficient documentation

## 2021-06-16 ENCOUNTER — Ambulatory Visit (INDEPENDENT_AMBULATORY_CARE_PROVIDER_SITE_OTHER): Payer: No Typology Code available for payment source | Admitting: Internal Medicine

## 2021-06-16 ENCOUNTER — Encounter: Payer: Self-pay | Admitting: Internal Medicine

## 2021-06-16 VITALS — BP 172/108 | HR 85 | Temp 98.1°F | Ht 67.0 in | Wt 344.0 lb

## 2021-06-16 DIAGNOSIS — E7800 Pure hypercholesterolemia, unspecified: Secondary | ICD-10-CM | POA: Diagnosis not present

## 2021-06-16 DIAGNOSIS — R7989 Other specified abnormal findings of blood chemistry: Secondary | ICD-10-CM

## 2021-06-16 DIAGNOSIS — I10 Essential (primary) hypertension: Secondary | ICD-10-CM | POA: Diagnosis not present

## 2021-06-16 DIAGNOSIS — F17200 Nicotine dependence, unspecified, uncomplicated: Secondary | ICD-10-CM

## 2021-06-16 DIAGNOSIS — Z23 Encounter for immunization: Secondary | ICD-10-CM

## 2021-06-16 DIAGNOSIS — R739 Hyperglycemia, unspecified: Secondary | ICD-10-CM | POA: Diagnosis not present

## 2021-06-16 DIAGNOSIS — E559 Vitamin D deficiency, unspecified: Secondary | ICD-10-CM | POA: Diagnosis not present

## 2021-06-16 DIAGNOSIS — Z0001 Encounter for general adult medical examination with abnormal findings: Secondary | ICD-10-CM

## 2021-06-16 DIAGNOSIS — E538 Deficiency of other specified B group vitamins: Secondary | ICD-10-CM | POA: Diagnosis not present

## 2021-06-16 DIAGNOSIS — D751 Secondary polycythemia: Secondary | ICD-10-CM

## 2021-06-16 LAB — HEPATIC FUNCTION PANEL
ALT: 36 U/L — ABNORMAL HIGH (ref 0–35)
AST: 39 U/L — ABNORMAL HIGH (ref 0–37)
Albumin: 4.5 g/dL (ref 3.5–5.2)
Alkaline Phosphatase: 91 U/L (ref 39–117)
Bilirubin, Direct: 0.1 mg/dL (ref 0.0–0.3)
Total Bilirubin: 0.4 mg/dL (ref 0.2–1.2)
Total Protein: 8.3 g/dL (ref 6.0–8.3)

## 2021-06-16 LAB — VITAMIN D 25 HYDROXY (VIT D DEFICIENCY, FRACTURES): VITD: 11.85 ng/mL — ABNORMAL LOW (ref 30.00–100.00)

## 2021-06-16 LAB — LIPID PANEL
Cholesterol: 217 mg/dL — ABNORMAL HIGH (ref 0–200)
HDL: 54.9 mg/dL (ref >39.00)
LDL Cholesterol: 136 mg/dL — ABNORMAL HIGH (ref 0–99)
NonHDL: 162.3
Total CHOL/HDL Ratio: 4
Triglycerides: 133 mg/dL (ref 0.0–149.0)
VLDL: 26.6 mg/dL (ref 0.0–40.0)

## 2021-06-16 LAB — CBC WITH DIFFERENTIAL/PLATELET
Basophils Absolute: 0.1 10*3/uL (ref 0.0–0.1)
Basophils Relative: 0.6 % (ref 0.0–3.0)
Eosinophils Absolute: 0.1 10*3/uL (ref 0.0–0.7)
Eosinophils Relative: 0.7 % (ref 0.0–5.0)
HCT: 48.4 % — ABNORMAL HIGH (ref 36.0–46.0)
Hemoglobin: 15.9 g/dL — ABNORMAL HIGH (ref 12.0–15.0)
Lymphocytes Relative: 25.7 % (ref 12.0–46.0)
Lymphs Abs: 3.6 10*3/uL (ref 0.7–4.0)
MCHC: 32.8 g/dL (ref 30.0–36.0)
MCV: 89.1 fl (ref 78.0–100.0)
Monocytes Absolute: 1.2 10*3/uL — ABNORMAL HIGH (ref 0.1–1.0)
Monocytes Relative: 8.9 % (ref 3.0–12.0)
Neutro Abs: 9 10*3/uL — ABNORMAL HIGH (ref 1.4–7.7)
Neutrophils Relative %: 64.1 % (ref 43.0–77.0)
Platelets: 262 10*3/uL (ref 150.0–400.0)
RBC: 5.43 Mil/uL — ABNORMAL HIGH (ref 3.87–5.11)
RDW: 14.2 % (ref 11.5–15.5)
WBC: 14 10*3/uL — ABNORMAL HIGH (ref 4.0–10.5)

## 2021-06-16 LAB — BASIC METABOLIC PANEL
BUN: 11 mg/dL (ref 6–23)
CO2: 29 mEq/L (ref 19–32)
Calcium: 10 mg/dL (ref 8.4–10.5)
Chloride: 102 mEq/L (ref 96–112)
Creatinine, Ser: 0.82 mg/dL (ref 0.40–1.20)
GFR: 92.06 mL/min (ref >60.00)
Glucose, Bld: 92 mg/dL (ref 70–99)
Potassium: 4.2 mEq/L (ref 3.5–5.1)
Sodium: 138 mEq/L (ref 135–145)

## 2021-06-16 LAB — HEMOGLOBIN A1C: Hgb A1c MFr Bld: 6.6 % — ABNORMAL HIGH (ref 4.6–6.5)

## 2021-06-16 LAB — VITAMIN B12: Vitamin B-12: 468 pg/mL (ref 211–911)

## 2021-06-16 LAB — TSH: TSH: 1.54 u[IU]/mL (ref 0.35–5.50)

## 2021-06-16 MED ORDER — LISINOPRIL 40 MG PO TABS: 40.0000 mg | ORAL_TABLET | Freq: Every day | ORAL | 3 refills | Status: DC

## 2021-06-16 MED ORDER — HYDROCHLOROTHIAZIDE 12.5 MG PO CAPS: 12.5000 mg | ORAL_CAPSULE | Freq: Every day | ORAL | 3 refills | Status: DC

## 2021-06-16 MED ORDER — ALPRAZOLAM 0.25 MG PO TABS
0.2500 mg | ORAL_TABLET | Freq: Two times a day (BID) | ORAL | 1 refills | Status: AC | PRN
Start: 2021-06-16 — End: ?

## 2021-06-16 NOTE — Patient Instructions (Addendum)
Please call HR to see about Wegovy (or ozempic or mounjaro) ? ?You had the Tdap tetanus shot today ? ?Please continue all other medications as before, and refills have been done if requested. ? ?Please have the pharmacy call with any other refills you may need. ? ?Please continue your efforts at being more active, low cholesterol diet, and weight control. ? ?You are otherwise up to date with prevention measures today. ? ?Please keep your appointments with your specialists as you may have planned ? ?Please go to the LAB at the blood drawing area for the tests to be done ? ?You will be contacted by phone if any changes need to be made immediately.  Otherwise, you will receive a letter about your results with an explanation, but please check with MyChart first. ? ?Please remember to sign up for MyChart if you have not done so, as this will be important to you in the future with finding out test results, communicating by private email, and scheduling acute appointments online when needed. ? ?Please make an Appointment to return for your 1 year visit, or sooner if needed ?

## 2021-06-16 NOTE — Progress Notes (Signed)
Patient ID: Jennifer Allison, female   DOB: [birth year], 36 y.o.   MRN: [id number] ? ? ? ?     Chief Complaint:: wellness exam and Follow-up ? Obesity, elevated hgb, htn ? ?     HPI:  Jennifer Allison is a 36 y.o. female here for wellness exam; declines hep c screen, due for TDap, o/w up to date ?         ?              Also Pt denies chest pain, increased sob or doe, wheezing, orthopnea, PND, increased LE swelling, palpitations, dizziness or syncope.   Pt denies polydipsia, polyuria, or new focal neuro s/s.    Pt denies fever, wt loss, night sweats, loss of appetite, or other constitutional symptoms  Has not taken her BP med in several days after running out.  Has not been checking BP at home but willing to start.  Wt continues to increase, does have some daytime somnolence but decliens osa eval /referral for now.  No other new complaints  would be willing to consider wegovy if covered by her employer.  ?  ?Wt Readings from Last 3 Encounters:  ?06/16/21 (!) 344 lb (156 kg)  ?11/14/20 (!) 339 lb (153.8 kg)  ?10/28/20 (!) 338 lb (153.3 kg)  ? ?BP Readings from Last 3 Encounters:  ?06/16/21 (!) 172/108  ?11/14/20 (!) 150/98  ?11/11/20 (!) 180/84  ? ?Immunization History  ?Administered Date(s) Administered  ? Moderna Sars-Covid-2 Vaccination 05/20/2019, 06/19/2019  ? Tdap 05/01/2003, 06/16/2021  ? ?There are no preventive care reminders to display for this patient. ? ?  ? ?Past Medical History:  ?Diagnosis Date  ? Anxiety 01/12/2017  ? Hyperlipidemia 05/27/2011  ? Hypertension   ? Migraines   ? Morbid obesity ([place])   ? ?History reviewed. No pertinent surgical history. ? reports that she has been smoking. She has never used smokeless tobacco. She reports current alcohol use. She reports that she does not use drugs. ?family history includes Cancer in an other family member; Diabetes in some other family members; Hyperlipidemia in her father and another family member; Hypertension in her father and another family member; Mental  illness in an other family member; Sudden death in an other family member. ?Allergies  ?Allergen Reactions  ? Penicillins Hives  ? Amlodipine Other (See Comments)  ?  Worsening migraines, dizziness  ? ?Current Outpatient Medications on File Prior to Visit  ?Medication Sig Dispense Refill  ? gabapentin (NEURONTIN) 300 MG capsule Take 300 mg by mouth 3 (three) times daily.    ? ?No current facility-administered medications on file prior to visit.  ? ?     ROS:  All others reviewed and negative. ? ?Objective  ? ?     PE:  BP (!) 172/108 (BP Location: Left Arm, Patient Position: Sitting, Cuff Size: Large)   Pulse 85   Temp 98.1 ?F (36.7 ?C) (Oral)   Ht '5\' 7"'$  (1.702 m)   Wt (!) 344 lb (156 kg)   LMP 05/24/2021   SpO2 96%   BMI 53.88 kg/m?  ? ?              Constitutional: Pt appears in NAD. Morbid obese ?              HENT: Head: NCAT.  ?              Right Ear: External ear normal.   ?  Left Ear: External ear normal.  ?              Eyes: . Pupils are equal, round, and reactive to light. Conjunctivae and EOM are normal ?              Nose: without d/c or deformity ?              Neck: Neck supple. Gross normal ROM ?              Cardiovascular: Normal rate and regular rhythm.   ?              Pulmonary/Chest: Effort normal and breath sounds without rales or wheezing.  ?              Abd:  Soft, NT, ND, + BS, no organomegaly ?              Neurological: Pt is alert. At baseline orientation, motor grossly intact ?              Skin: Skin is warm. No rashes, no other new lesions, LE edema - none ?              Psychiatric: Pt behavior is normal without agitation  ? ?Micro: none ? ?Cardiac tracings I have personally interpreted today:  none ? ?Pertinent Radiological findings (summarize): none  ? ?Lab Results  ?Component Value Date  ? WBC 14.0 (H) 06/16/2021  ? HGB 15.9 (H) 06/16/2021  ? HCT 48.4 (H) 06/16/2021  ? PLT 262.0 06/16/2021  ? GLUCOSE 92 06/16/2021  ? CHOL 217 (H) 06/16/2021  ? TRIG 133.0  06/16/2021  ? HDL 54.90 06/16/2021  ? LDLCALC 136 (H) 06/16/2021  ? ALT 36 (H) 06/16/2021  ? AST 39 (H) 06/16/2021  ? NA 138 06/16/2021  ? K 4.2 06/16/2021  ? CL 102 06/16/2021  ? CREATININE 0.82 06/16/2021  ? BUN 11 06/16/2021  ? CO2 29 06/16/2021  ? TSH 1.54 06/16/2021  ? HGBA1C 6.6 (H) 06/16/2021  ? ?Assessment/Plan:  ?[name] is a 36 y.o. Black or African American [2] female with  has a past medical history of Anxiety (01/12/2017), Hyperlipidemia (05/27/2011), Hypertension, Migraines, and Morbid obesity ([place]). ? ?Encounter for well adult exam with abnormal findings ?Age and sex appropriate education and counseling updated with regular exercise and diet ?Referrals for preventative services - declines hep c screen ?Immunizations addressed - for Tdap today ?Smoking counseling  - counseled to quit, pt not ready ?Evidence for depression or other mood disorder - none significant ?Most recent labs reviewed. ?I have personally reviewed and have noted: ?1) the patient's medical and social history ?2) The patient's current medications and supplements ?3) The patient's height, weight, and BMI have been recorded in the chart ? ? ?Smoker ?Pt counsled to quit, pt not ready ? ?Morbid obesity ([place]) ?Worsening uncontrolled, pt will check with insurance regarding wegovy or similar ? ?Low vitamin D level ?Last vitamin D ?Lab Results  ?Component Value Date  ? VD25OH 11.85 (L) 06/16/2021  ? ?Low, to start oral replacement ? ? ?Hypertension ?BP Readings from Last 3 Encounters:  ?06/16/21 (!) 172/108  ?11/14/20 (!) 150/98  ?11/11/20 (!) 180/84  ? ?Uncontrolled, has not taken med recently, pt to restart and pt to continue medical treatment hct, lisinopril ? ? ?Hyperlipidemia ?Lab Results  ?Component Value Date  ? LDLCALC 136 (H) 06/16/2021  ? ?Uncontrolled, goal ldl < 100, pt to continue current diet, declines statin ? ? ?  Hyperglycemia ?Lab Results  ?Component Value Date  ? HGBA1C 6.6 (H) 06/16/2021  ? ?New mildly elevaated,, pt  to continue current lower calorie diet, f/u alb next visit, and possible wegovy start if ok with insurance ? ?Polycythemia ?Lab Results  ?Component Value Date  ? WBC 14.0 (H) 06/16/2021  ? HGB 15.9 (H) 06/16/2021  ? HCT 48.4 (H) 06/16/2021  ? MCV 89.1 06/16/2021  ? PLT 262.0 06/16/2021  ?mild, I suspect related to underlying OSA, declines referral for eval and tx for now ? ?Followup: No follow-ups on file. ? ?Cathlean Cower, MD 06/21/2021 11:28 AM ?[facility] ?[place] ?Internal Medicine ?

## 2021-06-17 ENCOUNTER — Encounter: Payer: Self-pay | Admitting: Internal Medicine

## 2021-06-17 DIAGNOSIS — R7989 Other specified abnormal findings of blood chemistry: Secondary | ICD-10-CM | POA: Insufficient documentation

## 2021-06-17 LAB — URINALYSIS, ROUTINE W REFLEX MICROSCOPIC
Bilirubin Urine: NEGATIVE
Ketones, ur: NEGATIVE
Nitrite: NEGATIVE
Specific Gravity, Urine: 1.02 (ref 1.000–1.030)
Urine Glucose: NEGATIVE
Urobilinogen, UA: 0.2 (ref 0.0–1.0)
pH: 6.5 (ref 5.0–8.0)

## 2021-06-21 DIAGNOSIS — F17200 Nicotine dependence, unspecified, uncomplicated: Secondary | ICD-10-CM | POA: Insufficient documentation

## 2021-06-21 NOTE — Assessment & Plan Note (Signed)
Lab Results  ?Component Value Date  ? WBC 14.0 (H) 06/16/2021  ? HGB 15.9 (H) 06/16/2021  ? HCT 48.4 (H) 06/16/2021  ? MCV 89.1 06/16/2021  ? PLT 262.0 06/16/2021  ?mild, I suspect related to underlying OSA, declines referral for eval and tx for now ?

## 2021-06-21 NOTE — Assessment & Plan Note (Signed)
Pt counsled to quit, pt not ready °

## 2021-06-21 NOTE — Assessment & Plan Note (Signed)
Last vitamin D ?Lab Results  ?Component Value Date  ? VD25OH 11.85 (L) 06/16/2021  ? ?Low, to start oral replacement ? ?

## 2021-06-21 NOTE — Assessment & Plan Note (Signed)
Lab Results  ?Component Value Date  ? HGBA1C 6.6 (H) 06/16/2021  ? ?New mildly elevaated,, pt to continue current lower calorie diet, f/u alb next visit, and possible wegovy start if ok with insurance ?

## 2021-06-21 NOTE — Assessment & Plan Note (Signed)
Age and sex appropriate education and counseling updated with regular exercise and diet ?Referrals for preventative services - declines hep c screen ?Immunizations addressed - for Tdap today ?Smoking counseling  - counseled to quit, pt not ready ?Evidence for depression or other mood disorder - none significant ?Most recent labs reviewed. ?I have personally reviewed and have noted: ?1) the patient's medical and social history ?2) The patient's current medications and supplements ?3) The patient's height, weight, and BMI have been recorded in the chart ? ?

## 2021-06-21 NOTE — Assessment & Plan Note (Addendum)
BP Readings from Last 3 Encounters:  ?06/16/21 (!) 172/108  ?11/14/20 (!) 150/98  ?11/11/20 (!) 180/84  ? ?Uncontrolled, has not taken med recently, pt to restart and pt to continue medical treatment hct, lisinopril ? ?

## 2021-06-21 NOTE — Assessment & Plan Note (Signed)
Lab Results  ?Component Value Date  ? LDLCALC 136 (H) 06/16/2021  ? ?Uncontrolled, goal ldl < 100, pt to continue current diet, declines statin ? ?

## 2021-06-21 NOTE — Assessment & Plan Note (Signed)
Worsening uncontrolled, pt will check with insurance regarding wegovy or similar ?

## 2022-05-22 ENCOUNTER — Ambulatory Visit (INDEPENDENT_AMBULATORY_CARE_PROVIDER_SITE_OTHER): Payer: No Typology Code available for payment source | Admitting: Internal Medicine

## 2022-05-22 ENCOUNTER — Other Ambulatory Visit: Payer: Self-pay | Admitting: Internal Medicine

## 2022-05-22 VITALS — BP 132/84 | HR 96 | Temp 98.3°F | Ht 67.0 in | Wt 351.0 lb

## 2022-05-22 DIAGNOSIS — E7800 Pure hypercholesterolemia, unspecified: Secondary | ICD-10-CM

## 2022-05-22 DIAGNOSIS — E559 Vitamin D deficiency, unspecified: Secondary | ICD-10-CM

## 2022-05-22 DIAGNOSIS — Z1159 Encounter for screening for other viral diseases: Secondary | ICD-10-CM

## 2022-05-22 DIAGNOSIS — F17200 Nicotine dependence, unspecified, uncomplicated: Secondary | ICD-10-CM

## 2022-05-22 DIAGNOSIS — E538 Deficiency of other specified B group vitamins: Secondary | ICD-10-CM

## 2022-05-22 DIAGNOSIS — E611 Iron deficiency: Secondary | ICD-10-CM

## 2022-05-22 DIAGNOSIS — N760 Acute vaginitis: Secondary | ICD-10-CM | POA: Insufficient documentation

## 2022-05-22 DIAGNOSIS — Z0001 Encounter for general adult medical examination with abnormal findings: Secondary | ICD-10-CM | POA: Diagnosis not present

## 2022-05-22 DIAGNOSIS — I10 Essential (primary) hypertension: Secondary | ICD-10-CM

## 2022-05-22 DIAGNOSIS — O24419 Gestational diabetes mellitus in pregnancy, unspecified control: Secondary | ICD-10-CM

## 2022-05-22 DIAGNOSIS — R739 Hyperglycemia, unspecified: Secondary | ICD-10-CM

## 2022-05-22 DIAGNOSIS — R7989 Other specified abnormal findings of blood chemistry: Secondary | ICD-10-CM

## 2022-05-22 LAB — IBC PANEL
Iron: 49 ug/dL (ref 42–145)
Saturation Ratios: 8.9 % — ABNORMAL LOW (ref 20.0–50.0)
TIBC: 550.2 ug/dL — ABNORMAL HIGH (ref 250.0–450.0)
Transferrin: 393 mg/dL — ABNORMAL HIGH (ref 212.0–360.0)

## 2022-05-22 LAB — FERRITIN: Ferritin: 17.3 ng/mL (ref 10.0–291.0)

## 2022-05-22 LAB — HEPATIC FUNCTION PANEL
ALT: 35 U/L (ref 0–35)
AST: 54 U/L — ABNORMAL HIGH (ref 0–37)
Albumin: 4.5 g/dL (ref 3.5–5.2)
Alkaline Phosphatase: 76 U/L (ref 39–117)
Bilirubin, Direct: 0.1 mg/dL (ref 0.0–0.3)
Total Bilirubin: 0.4 mg/dL (ref 0.2–1.2)
Total Protein: 8.2 g/dL (ref 6.0–8.3)

## 2022-05-22 LAB — CBC WITH DIFFERENTIAL/PLATELET
Basophils Absolute: 0.1 10*3/uL (ref 0.0–0.1)
Basophils Relative: 0.6 % (ref 0.0–3.0)
Eosinophils Absolute: 0.1 10*3/uL (ref 0.0–0.7)
Eosinophils Relative: 1 % (ref 0.0–5.0)
HCT: 49.6 % — ABNORMAL HIGH (ref 36.0–46.0)
Hemoglobin: 16.2 g/dL — ABNORMAL HIGH (ref 12.0–15.0)
Lymphocytes Relative: 23.9 % (ref 12.0–46.0)
Lymphs Abs: 2.9 10*3/uL (ref 0.7–4.0)
MCHC: 32.7 g/dL (ref 30.0–36.0)
MCV: 89.3 fl (ref 78.0–100.0)
Monocytes Absolute: 1 10*3/uL (ref 0.1–1.0)
Monocytes Relative: 7.9 % (ref 3.0–12.0)
Neutro Abs: 8.1 10*3/uL — ABNORMAL HIGH (ref 1.4–7.7)
Neutrophils Relative %: 66.6 % (ref 43.0–77.0)
Platelets: 285 10*3/uL (ref 150.0–400.0)
RBC: 5.55 Mil/uL — ABNORMAL HIGH (ref 3.87–5.11)
RDW: 13.9 % (ref 11.5–15.5)
WBC: 12.2 10*3/uL — ABNORMAL HIGH (ref 4.0–10.5)

## 2022-05-22 LAB — TSH: TSH: 2.34 u[IU]/mL (ref 0.35–5.50)

## 2022-05-22 LAB — BASIC METABOLIC PANEL
BUN: 11 mg/dL (ref 6–23)
CO2: 24 mEq/L (ref 19–32)
Calcium: 10 mg/dL (ref 8.4–10.5)
Chloride: 103 mEq/L (ref 96–112)
Creatinine, Ser: 0.78 mg/dL (ref 0.40–1.20)
GFR: 97.12 mL/min (ref >60.00)
Glucose, Bld: 90 mg/dL (ref 70–99)
Potassium: 3.9 mEq/L (ref 3.5–5.1)
Sodium: 138 mEq/L (ref 135–145)

## 2022-05-22 LAB — HEMOGLOBIN A1C: Hgb A1c MFr Bld: 6.7 % — ABNORMAL HIGH (ref 4.6–6.5)

## 2022-05-22 LAB — LIPID PANEL
Cholesterol: 211 mg/dL — ABNORMAL HIGH (ref 0–200)
HDL: 57.6 mg/dL (ref >39.00)
LDL Cholesterol: 135 mg/dL — ABNORMAL HIGH (ref 0–99)
NonHDL: 153.08
Total CHOL/HDL Ratio: 4
Triglycerides: 90 mg/dL (ref 0.0–149.0)
VLDL: 18 mg/dL (ref 0.0–40.0)

## 2022-05-22 LAB — VITAMIN D 25 HYDROXY (VIT D DEFICIENCY, FRACTURES): VITD: 11.92 ng/mL — ABNORMAL LOW (ref 30.00–100.00)

## 2022-05-22 LAB — VITAMIN B12: Vitamin B-12: 314 pg/mL (ref 211–911)

## 2022-05-22 MED ORDER — METRONIDAZOLE 0.75 % VA GEL: 1.0000 | Freq: Two times a day (BID) | VAGINAL | 0 refills | Status: DC

## 2022-05-22 MED ORDER — LABETALOL HCL 100 MG PO TABS: 100.0000 mg | ORAL_TABLET | Freq: Two times a day (BID) | ORAL | 3 refills | Status: AC

## 2022-05-22 NOTE — Patient Instructions (Signed)
Ok to stop the lisinopril and hct as you have  Please take all new medication as prescribed - the labetolol 100 mg twice per day, and the metrogel  Please continue all other medications as before, and refills have been done if requested.  Please have the pharmacy call with any other refills you may need.  Please continue your efforts at being more active, low cholesterol diet, and weight control.  You are otherwise up to date with prevention measures today.  Please keep your appointments with your specialists as you may have planned  Please go to the LAB at the blood drawing area for the tests to be done  You will be contacted by phone if any changes need to be made immediately.  Otherwise, you will receive a letter about your results with an explanation, but please check with MyChart first.  Please remember to sign up for MyChart if you have not done so, as this will be important to you in the future with finding out test results, communicating by private email, and scheduling acute appointments online when needed.  Please make an Appointment to return in Apr 19, or sooner if needed

## 2022-05-22 NOTE — Progress Notes (Unsigned)
Patient ID: Jennifer Allison, female   DOB: Feb 02, 1986, 37 y.o.   MRN: [id number]         Chief Complaint:: wellness exam and medication (Discuss BP medication , she found out she is pregnant, she has stopped lisinoprol)  X 1 wk, and vaginitis likely bacterial       HPI:  Jennifer Allison is a 37 y.o. female here for wellness exam; pt to f/u with GYN for yearly pap, declines flu shot, for hep C screening, o/w up to date.  Still smoking but ready to quit.                        Also Pt denies chest pain, increased sob or doe, wheezing, orthopnea, PND, increased LE swelling, palpitations, dizziness or syncope.   Pt denies polydipsia, polyuria, or new focal neuro s/s.     Pt denies fever, wt loss, night sweats, loss of appetite, or other constitutional symptoms  Currently known pregnant, for first OB appt apr 4.  Has vagintiis symptoms for over 1 wk.  Has stopped her ACE over 3 wks ago after learned she was pregnant.  Still taking hct.     Wt Readings from Last 3 Encounters:  05/22/22 (!) 351 lb (159.2 kg)  06/16/21 (!) 344 lb (156 kg)  11/14/20 (!) 339 lb (153.8 kg)   BP Readings from Last 3 Encounters:  05/22/22 132/84  06/16/21 (!) 172/108  11/14/20 (!) 150/98   Immunization History  Administered Date(s) Administered   Moderna Sars-Covid-2 Vaccination 05/20/2019, 06/19/2019   Tdap 05/01/2003, 06/16/2021   Health Maintenance Due  Topic Date Due   PAP SMEAR-Modifier  05/01/2022      Past Medical History:  Diagnosis Date   Anxiety 01/12/2017   Hyperlipidemia 05/27/2011   Hypertension    Migraines    Morbid obesity ([place])    History reviewed. No pertinent surgical history.  reports that she has been smoking. She has never used smokeless tobacco. She reports current alcohol use. She reports that she does not use drugs. family history includes Cancer in an other family member; Diabetes in some other family members; Hyperlipidemia in her father and another family member; Hypertension in  her father and another family member; Mental illness in an other family member; Sudden death in an other family member. Allergies  Allergen Reactions   Penicillins Hives and Other (See Comments)   Amlodipine Other (See Comments)    Worsening migraines, dizziness   Current Outpatient Medications on File Prior to Visit  Medication Sig Dispense Refill   gabapentin (NEURONTIN) 300 MG capsule Take 300 mg by mouth 3 (three) times daily.     ALPRAZolam (XANAX) 0.25 MG tablet Take 1 tablet (0.25 mg total) by mouth 2 (two) times daily as needed for anxiety. (Patient not taking: Reported on 05/22/2022) 60 tablet 1   No current facility-administered medications on file prior to visit.        ROS:  All others reviewed and negative.  Objective        PE:  BP 132/84 (BP Location: Right Arm, Patient Position: Sitting, Cuff Size: Normal)   Pulse 96   Temp 98.3 F (36.8 C) (Oral)   Ht 5\' 7"  (1.702 m)   Wt (!) 351 lb (159.2 kg)   LMP 05/24/2021   SpO2 97%   BMI 54.97 kg/m                 Constitutional: Pt appears in NAD  HENT: Head: NCAT.                Right Ear: External ear normal.                 Left Ear: External ear normal.                Eyes: . Pupils are equal, round, and reactive to light. Conjunctivae and EOM are normal               Nose: without d/c or deformity               Neck: Neck supple. Gross normal ROM               Cardiovascular: Normal rate and regular rhythm.                 Pulmonary/Chest: Effort normal and breath sounds without rales or wheezing.                Abd:  Soft, NT, ND, + BS, no organomegaly               Neurological: Pt is alert. At baseline orientation, motor grossly intact               Skin: Skin is warm. No rashes, no other new lesions, LE edema - none               Psychiatric: Pt behavior is normal without agitation   Micro: none  Cardiac tracings I have personally interpreted today:  none  Pertinent Radiological findings  (summarize): none   Lab Results  Component Value Date   WBC 12.2 (H) 05/22/2022   HGB 16.2 (H) 05/22/2022   HCT 49.6 (H) 05/22/2022   PLT 285.0 05/22/2022   GLUCOSE 90 05/22/2022   CHOL 211 (H) 05/22/2022   TRIG 90.0 05/22/2022   HDL 57.60 05/22/2022   LDLCALC 135 (H) 05/22/2022   ALT 35 05/22/2022   AST 54 (H) 05/22/2022   NA 138 05/22/2022   K 3.9 05/22/2022   CL 103 05/22/2022   CREATININE 0.78 05/22/2022   BUN 11 05/22/2022   CO2 24 05/22/2022   TSH 2.34 05/22/2022   HGBA1C 6.7 (H) 05/22/2022   Assessment/Plan:  Jennifer Allison is a 37 y.o. Black or African American [2] female with  has a past medical history of Anxiety (01/12/2017), Hyperlipidemia (05/27/2011), Hypertension, Migraines, and Morbid obesity ([place]).  Encounter for well adult exam with abnormal findings Age and sex appropriate education and counseling updated with regular exercise and diet Referrals for preventative services - to f/u GYN regarding pap, for hep c screen Immunizations addressed - declines flu shot, covid booster Smoking counseling  - pt counsled to abstain with pregnancy Evidence for depression or other mood disorder - none significant Most recent labs reviewed. I have personally reviewed and have noted: 1) the patient's medical and social history 2) The patient's current medications and supplements 3) The patient's height, weight, and BMI have been recorded in the chart   Hyperglycemia Lab Results  Component Value Date   HGBA1C 6.7 (H) 05/22/2022   Stable, pt to continue current medical treatment  - mild gestational dm new onset - for endo referral   Hyperlipidemia Lab Results  Component Value Date   LDLCALC 135 (H) 05/22/2022   Uncontrolled, pt for lower chol DM diet   Hypertension BP Readings from Last 3 Encounters:  05/22/22 132/84  06/16/21 (!) 172/108  11/14/20 Marland Kitchen)  150/98   Mild uncontrolled, pt not currently taking ACE, but will change the hct to labetolol 100 bid  andf/u bp at home, next visit here and with OB apr 4   Low vitamin D level Last vitamin D Lab Results  Component Value Date   VD25OH 11.92 (L) 05/22/2022   Low, to start oral replacement   Smoker Counsled to quit, pt to abstain  Vaginitis Mild to mod, for vaginal suppost tx metrogel asd,  to f/u any worsening symptoms or concerns  Followup: Return in about 4 weeks (around 06/19/2022).  Cathlean Cower, MD 05/24/2022 7:27 PM [facility]

## 2022-05-24 ENCOUNTER — Encounter: Payer: Self-pay | Admitting: Internal Medicine

## 2022-05-24 NOTE — Assessment & Plan Note (Signed)
Age and sex appropriate education and counseling updated with regular exercise and diet Referrals for preventative services - to f/u GYN regarding pap, for hep c screen Immunizations addressed - declines flu shot, covid booster Smoking counseling  - pt counsled to abstain with pregnancy Evidence for depression or other mood disorder - none significant Most recent labs reviewed. I have personally reviewed and have noted: 1) the patient's medical and social history 2) The patient's current medications and supplements 3) The patient's height, weight, and BMI have been recorded in the chart

## 2022-05-24 NOTE — Assessment & Plan Note (Signed)
Lab Results  Component Value Date   LDLCALC 135 (H) 05/22/2022   Uncontrolled, pt for lower chol DM diet

## 2022-05-24 NOTE — Assessment & Plan Note (Signed)
BP Readings from Last 3 Encounters:  05/22/22 132/84  06/16/21 (!) 172/108  11/14/20 (!) 150/98   Mild uncontrolled, pt not currently taking ACE, but will change the hct to labetolol 100 bid andf/u bp at home, next visit here and with OB apr 4

## 2022-05-24 NOTE — Assessment & Plan Note (Signed)
Lab Results  Component Value Date   HGBA1C 6.7 (H) 05/22/2022   Stable, pt to continue current medical treatment  - mild gestational dm new onset - for endo referral

## 2022-05-24 NOTE — Assessment & Plan Note (Signed)
Counsled to quit, pt to abstain

## 2022-05-24 NOTE — Assessment & Plan Note (Addendum)
Mild to mod, for vaginal suppost tx metrogel asd,  to f/u any worsening symptoms or concerns

## 2022-05-24 NOTE — Assessment & Plan Note (Signed)
Last vitamin D Lab Results  Component Value Date   VD25OH 11.92 (L) 05/22/2022   Low, to start oral replacement

## 2022-05-25 LAB — HEPATITIS C ANTIBODY: Hepatitis C Ab: NONREACTIVE

## 2022-05-26 ENCOUNTER — Telehealth: Payer: Self-pay | Admitting: Internal Medicine

## 2022-05-26 NOTE — Telephone Encounter (Signed)
Pt called back she states she saw the results, but have not seen her OB-GYN yet, and will like to see them before seeing endocrinologist../lmb

## 2022-05-26 NOTE — Telephone Encounter (Signed)
It will prob work out that way anyway, thanks

## 2022-05-26 NOTE — Telephone Encounter (Signed)
Pt returned call for a nurse to go over her lab results.

## 2022-06-19 ENCOUNTER — Ambulatory Visit: Payer: No Typology Code available for payment source | Admitting: Internal Medicine

## 2022-06-19 ENCOUNTER — Encounter: Payer: Self-pay | Admitting: Internal Medicine

## 2022-06-19 VITALS — BP 128/78 | HR 90 | Temp 99.3°F | Ht 67.0 in | Wt 356.0 lb

## 2022-06-19 DIAGNOSIS — Z0001 Encounter for general adult medical examination with abnormal findings: Secondary | ICD-10-CM | POA: Diagnosis not present

## 2022-06-19 DIAGNOSIS — I10 Essential (primary) hypertension: Secondary | ICD-10-CM

## 2022-06-19 DIAGNOSIS — R7989 Other specified abnormal findings of blood chemistry: Secondary | ICD-10-CM | POA: Diagnosis not present

## 2022-06-19 DIAGNOSIS — Z3490 Encounter for supervision of normal pregnancy, unspecified, unspecified trimester: Secondary | ICD-10-CM

## 2022-06-19 DIAGNOSIS — F17200 Nicotine dependence, unspecified, uncomplicated: Secondary | ICD-10-CM | POA: Diagnosis not present

## 2022-06-19 DIAGNOSIS — R739 Hyperglycemia, unspecified: Secondary | ICD-10-CM

## 2022-06-19 DIAGNOSIS — E7800 Pure hypercholesterolemia, unspecified: Secondary | ICD-10-CM | POA: Diagnosis not present

## 2022-06-19 NOTE — Progress Notes (Unsigned)
Patient ID: Jennifer Allison, female   DOB: 1986/02/01, 37 y.o.   MRN: [id number]         Chief Complaint:: wellness exam and pregnancy, gestational dm       HPI:  Jennifer Allison is a 37 y.o. female here for wellness exam; up to date                Also Pt denies chest pain, increased sob or doe, wheezing, orthopnea, PND, increased LE swelling, palpitations, dizziness or syncope.   Pt denies polydipsia, polyuria, or new focal neuro s/s.    Pt denies fever, wt loss, night sweats, loss of appetite, or other constitutional symptoms  Pt doing well with her early first pregnancy, saw OB last wk, and has been referred to Endo for eleavted A1c for foirst visit next week.     Wt Readings from Last 3 Encounters:  06/19/22 (!) 356 lb (161.5 kg)  05/22/22 (!) 351 lb (159.2 kg)  06/16/21 (!) 344 lb (156 kg)   BP Readings from Last 3 Encounters:  06/19/22 128/78  05/22/22 132/84  06/16/21 (!) 172/108   Immunization History  Administered Date(s) Administered   Moderna Sars-Covid-2 Vaccination 05/20/2019, 06/19/2019   Tdap 05/01/2003, 06/16/2021  There are no preventive care reminders to display for this patient.    Past Medical History:  Diagnosis Date   Anxiety 01/12/2017   Hyperlipidemia 05/27/2011   Hypertension    Migraines    Morbid obesity    History reviewed. No pertinent surgical history.  reports that she has been smoking. She has never used smokeless tobacco. She reports current alcohol use. She reports that she does not use drugs. family history includes Cancer in an other family member; Diabetes in some other family members; Hyperlipidemia in her father and another family member; Hypertension in her father and another family member; Mental illness in an other family member; Sudden death in an other family member. Allergies  Allergen Reactions   Penicillins Hives and Other (See Comments)   Amlodipine Other (See Comments)    Worsening migraines, dizziness   Current Outpatient  Medications on File Prior to Visit  Medication Sig Dispense Refill   Cholecalciferol 125 MCG (5000 UT) TABS Take by mouth.     labetalol (NORMODYNE) 100 MG tablet Take 1 tablet (100 mg total) by mouth 2 (two) times daily. 180 tablet 3   ALPRAZolam (XANAX) 0.25 MG tablet Take 1 tablet (0.25 mg total) by mouth 2 (two) times daily as needed for anxiety. (Patient not taking: Reported on 05/22/2022) 60 tablet 1   gabapentin (NEURONTIN) 300 MG capsule Take 300 mg by mouth 3 (three) times daily. (Patient not taking: Reported on 06/19/2022)     metroNIDAZOLE (METROGEL) 0.75 % vaginal gel Place 1 Applicatorful vaginally 2 (two) times daily. (Patient not taking: Reported on 06/19/2022) 70 g 0   No current facility-administered medications on file prior to visit.        ROS:  All others reviewed and negative.  Objective        PE:  BP 128/78 (BP Location: Left Arm, Patient Position: Sitting, Cuff Size: Normal)   Pulse 90   Temp 99.3 F (37.4 C) (Oral)   Ht  (1.702 m)   Wt (!) 356 lb (161.5 kg)   SpO2 97%   BMI 55.76 kg/m                 Constitutional: Pt appears in NAD  HENT: Head: NCAT.                Right Ear: External ear normal.                 Left Ear: External ear normal.                Eyes: . Pupils are equal, round, and reactive to light. Conjunctivae and EOM are normal               Nose: without d/c or deformity               Neck: Neck supple. Gross normal ROM               Cardiovascular: Normal rate and regular rhythm.                 Pulmonary/Chest: Effort normal and breath sounds without rales or wheezing.                Abd:  Soft, NT, ND, + BS, no organomegaly               Neurological: Pt is alert. At baseline orientation, motor grossly intact               Skin: Skin is warm. No rashes, no other new lesions, LE edema - none               Psychiatric: Pt behavior is normal without agitation   Micro: none  Cardiac tracings I have personally  interpreted today:  none  Pertinent Radiological findings (summarize): none   Lab Results  Component Value Date   WBC 12.2 (H) 05/22/2022   HGB 16.2 (H) 05/22/2022   HCT 49.6 (H) 05/22/2022   PLT 285.0 05/22/2022   GLUCOSE 90 05/22/2022   CHOL 211 (H) 05/22/2022   TRIG 90.0 05/22/2022   HDL 57.60 05/22/2022   LDLCALC 135 (H) 05/22/2022   ALT 35 05/22/2022   AST 54 (H) 05/22/2022   NA 138 05/22/2022   K 3.9 05/22/2022   CL 103 05/22/2022   CREATININE 0.78 05/22/2022   BUN 11 05/22/2022   CO2 24 05/22/2022   TSH 2.34 05/22/2022   HGBA1C 6.7 (H) 05/22/2022   Assessment/Plan:  Jennifer Allison is a 37 y.o. Black or African American [2] female with  has a past medical history of Anxiety (01/12/2017), Hyperlipidemia (05/27/2011), Hypertension, Migraines, and Morbid obesity.  Encounter for well adult exam with abnormal findings Age and sex appropriate education and counseling updated with regular exercise and diet Referrals for preventative services - none needed Immunizations addressed - none needed Smoking counseling  - none needed Evidence for depression or other mood disorder - none significant Most recent labs reviewed. I have personally reviewed and have noted: 1) the patient's medical and social history 2) The patient's current medications and supplements 3) The patient's height, weight, and BMI have been recorded in the chart   Hyperglycemia Lab Results  Component Value Date   HGBA1C 6.7 (H) 05/22/2022   Stable, pt for endo appt next wk   Hyperlipidemia Lab Results  Component Value Date   LDLCALC 135 (H) 05/22/2022   Uncontrolled, pt for lower chol diet, goal ldl < 100   Hypertension BP Readings from Last 3 Encounters:  06/19/22 128/78  05/22/22 132/84  06/16/21 (!) 172/108   Stable, pt to continue medical treatment labetolol 100 bid   Low vitamin D level Last vitamin D Lab Results  Component Value Date   VD25OH 11.92 (L) 05/22/2022   Low, to  start oral replacement   Smoker Pt counsled to quit  Pregnancy Pt to f/u OB as planned, due date Jan 18 2023  Followup: Return in about 1 year (around 06/19/2023).  Oliver Barre, MD 06/20/2022 9:11 PM [facility]

## 2022-06-19 NOTE — Patient Instructions (Signed)
Please continue all other medications as before, and refills have been done if requested.  Please have the pharmacy call with any other refills you may need.  Please continue your efforts at being more active, low cholesterol diet, and weight control.  You are otherwise up to date with prevention measures today.  Please keep your appointments with your specialists as you may have planned - endocrinology next week  No further lab work needed today  Please make an Appointment to return for your 1 year visit, or sooner if needed

## 2022-06-20 ENCOUNTER — Encounter: Payer: Self-pay | Admitting: Internal Medicine

## 2022-06-20 DIAGNOSIS — Z3490 Encounter for supervision of normal pregnancy, unspecified, unspecified trimester: Secondary | ICD-10-CM | POA: Insufficient documentation

## 2022-06-20 NOTE — Assessment & Plan Note (Addendum)
Pt to f/u OB as planned, due date Jan 18 2023

## 2022-06-20 NOTE — Assessment & Plan Note (Signed)
Lab Results  Component Value Date   LDLCALC 135 (H) 05/22/2022   Uncontrolled, pt for lower chol diet, goal ldl < 100

## 2022-06-20 NOTE — Assessment & Plan Note (Signed)
Lab Results  Component Value Date   HGBA1C 6.7 (H) 05/22/2022   Stable, pt for endo appt next wk

## 2022-06-20 NOTE — Assessment & Plan Note (Signed)
Last vitamin D Lab Results  Component Value Date   VD25OH 11.92 (L) 05/22/2022   Low, to start oral replacement  

## 2022-06-20 NOTE — Assessment & Plan Note (Signed)
BP Readings from Last 3 Encounters:  06/19/22 128/78  05/22/22 132/84  06/16/21 (!) 172/108   Stable, pt to continue medical treatment labetolol 100 bid

## 2022-06-20 NOTE — Assessment & Plan Note (Signed)
Pt counsled to quit

## 2022-06-20 NOTE — Assessment & Plan Note (Signed)

## 2022-07-01 ENCOUNTER — Other Ambulatory Visit: Payer: Self-pay | Admitting: Internal Medicine

## 2022-07-13 ENCOUNTER — Other Ambulatory Visit: Payer: Self-pay

## 2022-09-28 DIAGNOSIS — R87610 Atypical squamous cells of undetermined significance on cytologic smear of cervix (ASC-US): Secondary | ICD-10-CM | POA: Insufficient documentation

## 2023-01-02 DIAGNOSIS — Z98891 History of uterine scar from previous surgery: Secondary | ICD-10-CM | POA: Insufficient documentation

## 2023-01-14 DIAGNOSIS — E119 Type 2 diabetes mellitus without complications: Secondary | ICD-10-CM | POA: Insufficient documentation

## 2023-05-21 DIAGNOSIS — M25531 Pain in right wrist: Secondary | ICD-10-CM | POA: Insufficient documentation

## 2023-06-25 ENCOUNTER — Ambulatory Visit: Payer: No Typology Code available for payment source | Admitting: Internal Medicine

## 2023-06-25 ENCOUNTER — Encounter: Payer: Self-pay | Admitting: Internal Medicine

## 2023-06-25 VITALS — BP 132/76 | HR 75 | Temp 98.3°F | Ht 67.0 in | Wt 328.0 lb

## 2023-06-25 DIAGNOSIS — R7989 Other specified abnormal findings of blood chemistry: Secondary | ICD-10-CM

## 2023-06-25 DIAGNOSIS — F17200 Nicotine dependence, unspecified, uncomplicated: Secondary | ICD-10-CM

## 2023-06-25 DIAGNOSIS — Z0001 Encounter for general adult medical examination with abnormal findings: Secondary | ICD-10-CM | POA: Diagnosis not present

## 2023-06-25 DIAGNOSIS — E1165 Type 2 diabetes mellitus with hyperglycemia: Secondary | ICD-10-CM | POA: Diagnosis not present

## 2023-06-25 DIAGNOSIS — E538 Deficiency of other specified B group vitamins: Secondary | ICD-10-CM

## 2023-06-25 DIAGNOSIS — Z7985 Long-term (current) use of injectable non-insulin antidiabetic drugs: Secondary | ICD-10-CM

## 2023-06-25 DIAGNOSIS — Z794 Long term (current) use of insulin: Secondary | ICD-10-CM

## 2023-06-25 DIAGNOSIS — I10 Essential (primary) hypertension: Secondary | ICD-10-CM

## 2023-06-25 DIAGNOSIS — E7800 Pure hypercholesterolemia, unspecified: Secondary | ICD-10-CM

## 2023-06-25 LAB — BASIC METABOLIC PANEL WITH GFR
BUN: 9 mg/dL (ref 6–23)
CO2: 26 meq/L (ref 19–32)
Calcium: 9.3 mg/dL (ref 8.4–10.5)
Chloride: 105 meq/L (ref 96–112)
Creatinine, Ser: 0.79 mg/dL (ref 0.40–1.20)
GFR: 94.92 mL/min (ref >60.00)
Glucose, Bld: 101 mg/dL — ABNORMAL HIGH (ref 70–99)
Potassium: 3.9 meq/L (ref 3.5–5.1)
Sodium: 138 meq/L (ref 135–145)

## 2023-06-25 LAB — URINALYSIS, ROUTINE W REFLEX MICROSCOPIC
Bilirubin Urine: NEGATIVE
Hgb urine dipstick: NEGATIVE
Ketones, ur: NEGATIVE
Nitrite: NEGATIVE
RBC / HPF: NONE SEEN (ref 0–?)
Specific Gravity, Urine: 1.015 (ref 1.000–1.030)
Total Protein, Urine: 100 — AB
Urine Glucose: NEGATIVE
Urobilinogen, UA: 0.2 (ref 0.0–1.0)
pH: 6 (ref 5.0–8.0)

## 2023-06-25 LAB — MICROALBUMIN / CREATININE URINE RATIO
Creatinine,U: 80.4 mg/dL
Microalb Creat Ratio: 412.9 mg/g — ABNORMAL HIGH (ref 0.0–30.0)
Microalb, Ur: 33.2 mg/dL — ABNORMAL HIGH (ref 0.0–1.9)

## 2023-06-25 LAB — HEPATIC FUNCTION PANEL
ALT: 25 U/L (ref 0–35)
AST: 30 U/L (ref 0–37)
Albumin: 4.3 g/dL (ref 3.5–5.2)
Alkaline Phosphatase: 76 U/L (ref 39–117)
Bilirubin, Direct: 0.1 mg/dL (ref 0.0–0.3)
Total Bilirubin: 0.3 mg/dL (ref 0.2–1.2)
Total Protein: 7.7 g/dL (ref 6.0–8.3)

## 2023-06-25 LAB — CBC WITH DIFFERENTIAL/PLATELET
Basophils Absolute: 0.1 10*3/uL (ref 0.0–0.1)
Basophils Relative: 0.6 % (ref 0.0–3.0)
Eosinophils Absolute: 0.2 10*3/uL (ref 0.0–0.7)
Eosinophils Relative: 1.3 % (ref 0.0–5.0)
HCT: 42.2 % (ref 36.0–46.0)
Hemoglobin: 13.2 g/dL (ref 12.0–15.0)
Lymphocytes Relative: 24.2 % (ref 12.0–46.0)
Lymphs Abs: 2.8 10*3/uL (ref 0.7–4.0)
MCHC: 31.2 g/dL (ref 30.0–36.0)
MCV: 79.1 fl (ref 78.0–100.0)
Monocytes Absolute: 0.8 10*3/uL (ref 0.1–1.0)
Monocytes Relative: 7 % (ref 3.0–12.0)
Neutro Abs: 7.7 10*3/uL (ref 1.4–7.7)
Neutrophils Relative %: 66.9 % (ref 43.0–77.0)
Platelets: 314 10*3/uL (ref 150.0–400.0)
RBC: 5.33 Mil/uL — ABNORMAL HIGH (ref 3.87–5.11)
RDW: 17.1 % — ABNORMAL HIGH (ref 11.5–15.5)
WBC: 11.5 10*3/uL — ABNORMAL HIGH (ref 4.0–10.5)

## 2023-06-25 LAB — LIPID PANEL
Cholesterol: 186 mg/dL (ref 0–200)
HDL: 46.6 mg/dL (ref >39.00)
LDL Cholesterol: 116 mg/dL — ABNORMAL HIGH (ref 0–99)
NonHDL: 139.03
Total CHOL/HDL Ratio: 4
Triglycerides: 114 mg/dL (ref 0.0–149.0)
VLDL: 22.8 mg/dL (ref 0.0–40.0)

## 2023-06-25 LAB — VITAMIN B12: Vitamin B-12: 498 pg/mL (ref 211–911)

## 2023-06-25 LAB — VITAMIN D 25 HYDROXY (VIT D DEFICIENCY, FRACTURES): VITD: 21.99 ng/mL — ABNORMAL LOW (ref 30.00–100.00)

## 2023-06-25 LAB — HEMOGLOBIN A1C: Hgb A1c MFr Bld: 6 % (ref 4.6–6.5)

## 2023-06-25 LAB — TSH: TSH: 1.21 u[IU]/mL (ref 0.35–5.50)

## 2023-06-25 MED ORDER — TIRZEPATIDE 2.5 MG/0.5ML ~~LOC~~ SOAJ: 2.5000 mg | SUBCUTANEOUS | 11 refills | Status: DC

## 2023-06-25 NOTE — Assessment & Plan Note (Addendum)
 Lab Results  Component Value Date   LDLCALC 116 (H) 06/25/2023   uncontrolled, pt for DM low chol diet, decliens statin for now,  to f/u any worsening symptoms or concerns

## 2023-06-25 NOTE — Patient Instructions (Signed)
 Please take all new medication as prescribed  - the mounjaro 2.5 mg weekly, and call in 1 month for the higher dose if you are doing ok  Please remember to call for your eye doctor appt  Please continue all other medications as before, and refills have been done if requested.  Please have the pharmacy call with any other refills you may need.  Please continue your efforts at being more active, low cholesterol diet, and weight control.  You are otherwise up to date with prevention measures today.  Please keep your appointments with your specialists as you may have planned  Please go to the LAB at the blood drawing area for the tests to be done  You will be contacted by phone if any changes need to be made immediately.  Otherwise, you will receive a letter about your results with an explanation, but please check with MyChart first.  Please make an Appointment to return in 6 months, or sooner if needed

## 2023-06-25 NOTE — Assessment & Plan Note (Signed)
 BP Readings from Last 3 Encounters:  06/25/23 132/76  06/19/22 128/78  05/22/22 132/84   Stable, pt to continue medical treatment labetolol 100 bid

## 2023-06-25 NOTE — Assessment & Plan Note (Signed)
 Pt counsled to stop, pt not ready

## 2023-06-25 NOTE — Assessment & Plan Note (Signed)
 Age and sex appropriate education and counseling updated with regular exercise and diet Referrals for preventative services - pt to call for eye exam soon Immunizations addressed - declines prevnar 20 Smoking counseling  - none needed Evidence for depression or other mood disorder - chronic anxiety overall stable Most recent labs reviewed. I have personally reviewed and have noted: 1) the patient's medical and social history 2) The patient's current medications and supplements 3) The patient's height, weight, and BMI have been recorded in the chart

## 2023-06-25 NOTE — Progress Notes (Signed)
 Patient ID: Jennifer Allison, female   DOB: Aug 10, 1985, 38 y.o.   MRN: [id number]         Chief Complaint:: wellness exam and htn, DM, low vit d, smoker       HPI:  Jennifer Allison is a 38 y.o. female here for wellness exam; plans to call for eye exam soon, declines prevnar 20, o/w up to date                        Also had midl post partum depression now improved.  Has new onset DM post partum diagnosed per GYN not currently on OHA or other tx, in the setting of morbid obesity.  Pt denies chest pain, increased sob or doe, wheezing, orthopnea, PND, increased LE swelling, palpitations, dizziness or syncope.   Pt denies polydipsia, polyuria, or new focal neuro s/s.      Wt Readings from Last 3 Encounters:  06/25/23 (!) 328 lb (148.8 kg)  06/19/22 (!) 356 lb (161.5 kg)  05/22/22 (!) 351 lb (159.2 kg)   BP Readings from Last 3 Encounters:  06/25/23 132/76  06/19/22 128/78  05/22/22 132/84   Immunization History  Administered Date(s) Administered   HPV 9-valent 02/10/2023, 04/13/2023   Moderna Sars-Covid-2 Vaccination 05/20/2019, 06/19/2019   Tdap 05/01/2003, 06/16/2021, 10/30/2022   Health Maintenance Due  Topic Date Due   OPHTHALMOLOGY EXAM  Never done   Pneumococcal Vaccine 19-24 Years old (1 of 2 - PCV) Never done   HPV VACCINES (3 - Risk 3-dose SCDM series) 08/11/2023      Past Medical History:  Diagnosis Date   Anxiety 01/12/2017   Hyperlipidemia 05/27/2011   Hypertension    Migraines    Morbid obesity (HCC)    History reviewed. No pertinent surgical history.  reports that she has been smoking. She has never used smokeless tobacco. She reports current alcohol use. She reports that she does not use drugs. family history includes Cancer in an other family member; Diabetes in some other family members; Hyperlipidemia in her father and another family member; Hypertension in her father and another family member; Mental illness in an other family member; Sudden death in an other  family member. Allergies  Allergen Reactions   Amlodipine  Other (See Comments)    Worsening migraines, dizziness   Current Outpatient Medications on File Prior to Visit  Medication Sig Dispense Refill   insulin glargine (LANTUS SOLOSTAR) 100 UNIT/ML Solostar Pen Inject 50 Units into the skin daily.     labetalol  (NORMODYNE ) 100 MG tablet Take 1 tablet (100 mg total) by mouth 2 (two) times daily. (Patient taking differently: Take 200 mg by mouth 2 (two) times daily.) 180 tablet 3   ALPRAZolam  (XANAX ) 0.25 MG tablet Take 1 tablet (0.25 mg total) by mouth 2 (two) times daily as needed for anxiety. (Patient not taking: Reported on 06/25/2023) 60 tablet 1   Cholecalciferol 125 MCG (5000 UT) TABS Take by mouth. (Patient not taking: Reported on 06/25/2023)     No current facility-administered medications on file prior to visit.        ROS:  All others reviewed and negative.  Objective        PE:  BP 132/76 (BP Location: Right Arm, Patient Position: Sitting, Cuff Size: Normal)   Pulse 75   Temp 98.3 F (36.8 C) (Oral)   Ht 5\' 7"  (1.702 m)   Wt (!) 328 lb (148.8 kg)   LMP 06/14/2023 (Exact Date)   SpO2  98%   Breastfeeding No   BMI 51.37 kg/m                 Constitutional: Pt appears in NAD               HENT: Head: NCAT.                Right Ear: External ear normal.                 Left Ear: External ear normal.                Eyes: . Pupils are equal, round, and reactive to light. Conjunctivae and EOM are normal               Nose: without d/c or deformity               Neck: Neck supple. Gross normal ROM               Cardiovascular: Normal rate and regular rhythm.                 Pulmonary/Chest: Effort normal and breath sounds without rales or wheezing.                Abd:  Soft, NT, ND, + BS, no organomegaly               Neurological: Pt is alert. At baseline orientation, motor grossly intact               Skin: Skin is warm. No rashes, no other new lesions, LE edema - none                Psychiatric: Pt behavior is normal without agitation , mild nervous  Micro: none  Cardiac tracings I have personally interpreted today:  none  Pertinent Radiological findings (summarize): none   Lab Results  Component Value Date   WBC 11.5 (H) 06/25/2023   HGB 13.2 06/25/2023   HCT 42.2 06/25/2023   PLT 314.0 06/25/2023   GLUCOSE 101 (H) 06/25/2023   CHOL 186 06/25/2023   TRIG 114.0 06/25/2023   HDL 46.60 06/25/2023   LDLCALC 116 (H) 06/25/2023   ALT 25 06/25/2023   AST 30 06/25/2023   NA 138 06/25/2023   K 3.9 06/25/2023   CL 105 06/25/2023   CREATININE 0.79 06/25/2023   BUN 9 06/25/2023   CO2 26 06/25/2023   TSH 1.21 06/25/2023   HGBA1C 6.0 06/25/2023   MICROALBUR 33.2 (H) 06/25/2023   Assessment/Plan:  [name] is a 38 y.o. Black or African American [2] female with  has a past medical history of Anxiety (01/12/2017), Hyperlipidemia (05/27/2011), Hypertension, Migraines, and Morbid obesity (HCC).  Encounter for well adult exam with abnormal findings Age and sex appropriate education and counseling updated with regular exercise and diet Referrals for preventative services - pt to call for eye exam soon Immunizations addressed - declines prevnar 20 Smoking counseling  - none needed Evidence for depression or other mood disorder - chronic anxiety overall stable Most recent labs reviewed. I have personally reviewed and have noted: 1) the patient's medical and social history 2) The patient's current medications and supplements 3) The patient's height, weight, and BMI have been recorded in the chart   Hyperlipidemia Lab Results  Component Value Date   LDLCALC 116 (H) 06/25/2023   uncontrolled, pt for DM low chol diet, decliens statin for now,  to f/u any worsening symptoms  or concerns   Hypertension BP Readings from Last 3 Encounters:  06/25/23 132/76  06/19/22 128/78  05/22/22 132/84   Stable, pt to continue medical treatment labetolol 100  bid  Low vitamin D  level Last vitamin D  Lab Results  Component Value Date   VD25OH 21.99 (L) 06/25/2023   Low, to start oral replacement   Smoker Pt counsled to stop, pt not ready  Type 2 diabetes mellitus (HCC) Lab Results  Component Value Date   HGBA1C 6.0 06/25/2023   In the setting of uncontrolled morbid obesity, to continue lantus 50 u every day, and add mounjaro 2.5 mg weekly with intent to titrate  Followup: Return in about 6 months (around 12/25/2023).  Rosalia Colonel, MD 06/25/2023 9:53 PM [facility]

## 2023-06-25 NOTE — Assessment & Plan Note (Signed)
 Lab Results  Component Value Date   HGBA1C 6.0 06/25/2023   In the setting of uncontrolled morbid obesity, to continue lantus 50 u every day, and add mounjaro 2.5 mg weekly with intent to titrate

## 2023-06-25 NOTE — Assessment & Plan Note (Signed)
 Last vitamin D  Lab Results  Component Value Date   VD25OH 21.99 (L) 06/25/2023   Low, to start oral replacement

## 2023-07-06 ENCOUNTER — Encounter: Payer: Self-pay | Admitting: Internal Medicine

## 2023-07-19 ENCOUNTER — Encounter: Payer: Self-pay | Admitting: Internal Medicine

## 2023-07-19 MED ORDER — TIRZEPATIDE 5 MG/0.5ML ~~LOC~~ SOAJ: 5.0000 mg | SUBCUTANEOUS | 3 refills | Status: AC

## 2023-08-04 ENCOUNTER — Encounter: Payer: Self-pay | Admitting: Internal Medicine

## 2023-08-05 MED ORDER — LISINOPRIL-HYDROCHLOROTHIAZIDE 20-12.5 MG PO TABS
1.0000 | ORAL_TABLET | Freq: Every day | ORAL | 3 refills | Status: AC
Start: 2023-08-05 — End: ?

## 2023-12-31 ENCOUNTER — Ambulatory Visit: Admitting: Internal Medicine
